# Patient Record
Sex: Female | Born: 1975 | Race: Black or African American | Hispanic: No | Marital: Single | Smoking: Never smoker
Health system: Southern US, Community
[De-identification: ages and names within clinical notes are randomized; demographics above are authoritative.]

## PROBLEM LIST (undated history)

## (undated) ENCOUNTER — Inpatient Hospital Stay (HOSPITAL_COMMUNITY): Payer: Self-pay

## (undated) DIAGNOSIS — A6000 Herpesviral infection of urogenital system, unspecified: Secondary | ICD-10-CM

## (undated) DIAGNOSIS — R51 Headache: Secondary | ICD-10-CM

## (undated) DIAGNOSIS — K219 Gastro-esophageal reflux disease without esophagitis: Secondary | ICD-10-CM

## (undated) DIAGNOSIS — D259 Leiomyoma of uterus, unspecified: Secondary | ICD-10-CM

## (undated) HISTORY — PX: TENDON REPAIR: SHX5111

## (undated) HISTORY — PX: HAND TENDON SURGERY: SHX663

## (undated) HISTORY — PX: WISDOM TOOTH EXTRACTION: SHX21

---

## 2002-02-03 ENCOUNTER — Other Ambulatory Visit: Admission: RE | Admit: 2002-02-03 | Discharge: 2002-02-03 | Payer: Self-pay | Admitting: Obstetrics and Gynecology

## 2003-02-05 ENCOUNTER — Other Ambulatory Visit: Admission: RE | Admit: 2003-02-05 | Discharge: 2003-02-05 | Payer: Self-pay | Admitting: Obstetrics and Gynecology

## 2004-09-16 ENCOUNTER — Emergency Department (HOSPITAL_COMMUNITY): Admission: EM | Admit: 2004-09-16 | Discharge: 2004-09-16 | Payer: Self-pay | Admitting: Family Medicine

## 2004-12-02 ENCOUNTER — Emergency Department (HOSPITAL_COMMUNITY): Admission: EM | Admit: 2004-12-02 | Discharge: 2004-12-02 | Payer: Self-pay | Admitting: Family Medicine

## 2004-12-29 ENCOUNTER — Emergency Department (HOSPITAL_COMMUNITY): Admission: EM | Admit: 2004-12-29 | Discharge: 2004-12-29 | Payer: Self-pay | Admitting: Emergency Medicine

## 2006-01-21 ENCOUNTER — Emergency Department (HOSPITAL_COMMUNITY): Admission: EM | Admit: 2006-01-21 | Discharge: 2006-01-21 | Payer: Self-pay | Admitting: Family Medicine

## 2006-11-18 ENCOUNTER — Emergency Department (HOSPITAL_COMMUNITY): Admission: EM | Admit: 2006-11-18 | Discharge: 2006-11-18 | Payer: Self-pay | Admitting: Family Medicine

## 2007-03-01 ENCOUNTER — Emergency Department (HOSPITAL_COMMUNITY): Admission: EM | Admit: 2007-03-01 | Discharge: 2007-03-01 | Payer: Self-pay | Admitting: Family Medicine

## 2007-11-27 ENCOUNTER — Emergency Department (HOSPITAL_COMMUNITY): Admission: EM | Admit: 2007-11-27 | Discharge: 2007-11-27 | Payer: Self-pay | Admitting: Family Medicine

## 2007-12-09 ENCOUNTER — Emergency Department (HOSPITAL_COMMUNITY): Admission: EM | Admit: 2007-12-09 | Discharge: 2007-12-09 | Payer: Self-pay | Admitting: Emergency Medicine

## 2008-06-01 ENCOUNTER — Emergency Department (HOSPITAL_COMMUNITY): Admission: EM | Admit: 2008-06-01 | Discharge: 2008-06-01 | Payer: Self-pay | Admitting: Emergency Medicine

## 2008-10-19 ENCOUNTER — Ambulatory Visit (HOSPITAL_BASED_OUTPATIENT_CLINIC_OR_DEPARTMENT_OTHER): Admission: RE | Admit: 2008-10-19 | Discharge: 2008-10-19 | Payer: Self-pay | Admitting: Orthopedic Surgery

## 2008-12-12 ENCOUNTER — Emergency Department (HOSPITAL_COMMUNITY): Admission: EM | Admit: 2008-12-12 | Discharge: 2008-12-12 | Payer: Self-pay | Admitting: Family Medicine

## 2009-07-16 ENCOUNTER — Emergency Department (HOSPITAL_COMMUNITY): Admission: EM | Admit: 2009-07-16 | Discharge: 2009-07-16 | Payer: Self-pay | Admitting: Family Medicine

## 2009-12-19 ENCOUNTER — Emergency Department (HOSPITAL_COMMUNITY): Admission: EM | Admit: 2009-12-19 | Discharge: 2009-12-19 | Payer: Self-pay | Admitting: Family Medicine

## 2010-02-15 ENCOUNTER — Emergency Department (HOSPITAL_COMMUNITY): Admission: EM | Admit: 2010-02-15 | Discharge: 2010-02-15 | Payer: Self-pay | Admitting: Emergency Medicine

## 2010-04-26 ENCOUNTER — Emergency Department (HOSPITAL_COMMUNITY): Admission: EM | Admit: 2010-04-26 | Discharge: 2010-04-26 | Payer: Self-pay | Admitting: Emergency Medicine

## 2010-09-10 ENCOUNTER — Emergency Department (HOSPITAL_COMMUNITY): Admission: EM | Admit: 2010-09-10 | Discharge: 2010-09-10 | Payer: Self-pay | Admitting: Emergency Medicine

## 2011-01-25 ENCOUNTER — Emergency Department (HOSPITAL_COMMUNITY)
Admission: EM | Admit: 2011-01-25 | Discharge: 2011-01-26 | Disposition: A | Discharge status: Home / Self Care | Payer: Self-pay | Attending: Emergency Medicine | Admitting: Emergency Medicine

## 2011-01-25 ENCOUNTER — Emergency Department (HOSPITAL_COMMUNITY): Payer: Self-pay

## 2011-01-25 DIAGNOSIS — J309 Allergic rhinitis, unspecified: Secondary | ICD-10-CM | POA: Insufficient documentation

## 2011-01-25 DIAGNOSIS — J3489 Other specified disorders of nose and nasal sinuses: Secondary | ICD-10-CM | POA: Insufficient documentation

## 2011-01-25 DIAGNOSIS — R079 Chest pain, unspecified: Secondary | ICD-10-CM | POA: Insufficient documentation

## 2011-01-25 DIAGNOSIS — R05 Cough: Secondary | ICD-10-CM | POA: Insufficient documentation

## 2011-01-25 DIAGNOSIS — R059 Cough, unspecified: Secondary | ICD-10-CM | POA: Insufficient documentation

## 2011-01-25 LAB — RAPID STREP SCREEN (MED CTR MEBANE ONLY): Streptococcus, Group A Screen (Direct): NEGATIVE

## 2011-02-02 LAB — URINE MICROSCOPIC-ADD ON

## 2011-02-02 LAB — URINALYSIS, ROUTINE W REFLEX MICROSCOPIC
Bilirubin Urine: NEGATIVE
Glucose, UA: NEGATIVE mg/dL
Specific Gravity, Urine: 1.018 (ref 1.005–1.030)

## 2011-02-02 LAB — PREGNANCY, URINE: Preg Test, Ur: NEGATIVE

## 2011-03-31 NOTE — Op Note (Signed)
NAMEARYEL, EDELEN               ACCOUNT NO.:  [id number]   MEDICAL RECORD NO.:  [id number]          PATIENT TYPE:  AMB   LOCATION:  DSC                          FACILITY:  [facility]   PHYSICIAN:  Katy Fitch. Sypher, M.D. 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   OPERATION:  Release of left long finger A1 pulley under local anesthesia  (this was performed in a minor room setting).   OPERATING SURGEON:  Katy Fitch. Sypher, MD   ASSISTANT:  Marveen Reeks Dasnoit, PA-C   ANESTHESIA:  Lidocaine 2% field block, left palm without supplemental  sedation.   SUPERVISING ANESTHESIOLOGIST/ANESTHETIST:  Katy Fitch. 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  She was  referred through the courtesy of Dr. Kern Reap in December 2008  for evaluation and management of a locking left long finger.   She was initially injected in December 2008 and had good relief for 5  months.  She then returned in May 2009 and had persistent triggering.  She was reinjected at that time and was advised in May 2009 that should  she have recurrence, release of the A1 pulley would be appropriate.   She returned in November 2009 with recurrent triggering and discomfort.  At that time, we advised proceeding with release of her left long finger  A1 pulley.  She is now brought to the minor operating room setting for  release of the left long finger A1 pulley under local anesthesia.   Preoperatively, she was advised of the potential risks and benefits  of  the surgery.  Questions were invited and answered in detail.   PROCEDURE IN DETAIL:  Anita Boyd was brought to the operating room  and placed in supine position upon the operating table.   Following Betadine prep, the left palm and flexor sheath was injected  with 2% lidocaine.  After 5 minutes, excellent anesthesia was achieved.   She subsequently had routine Betadine scrub and paint of the left upper  extremity.   The hand was draped with sterile towels.   The arm was exsanguinated by direct compression and fist compression by  Ms. Savary, followed by inflation of an arterial tourniquet on the  proximal left brachium to 220 mmHg.  The procedure commenced with an 8-  mm long incision directly over the proximal margin of the A1 pulley.   Subcutaneous tissues were carefully divided taking care to identify and  release the palmar fascia.  The A1 pulley was isolated, the synovial  sheath incised, and the A1 pulley was released in its midline with  scissors.  Thereafter, active range of motion finger was demonstrated.   There was  still catching at the junction between the A1 and A2 pulley.  Therefore, a small extension of the release of 3 mm was accomplished  distal to the A1 pulley.  This relieved the catching.   Ms. Fehnel had a remarkable amount of synovial fluid present.   This could be signs of a synovitis and possible inflammatory  predicament.   We will be vigilant for any signs of progressive synovitis and/or  arthritis.   The wound was inspected for bleeding points and subsequently repaired  with 3 horizontal mattress sutures of 5-0 nylon.  A compressive dressing  was applied with a Steri-Strip, sterile gauze, and Ace wrap.   Ms. Shelley is advised to continue with active range of motion excises.  She is provided a note stating that she should remain on right hand duty  at work for the next week.   I will see her back for followup in our office in a week for  suture  removal or sooner if any problems with dressing or other issues.  She is  encouraged to contact our office with any concerns.      Katy Fitch Sypher, M.D.  Electronically Signed     RVS/MEDQ  D:  10/19/2008  T:  10/19/2008  Job:  [id number]   cc:   Merlene Laughter. Renae Gloss, M.D.

## 2011-09-22 ENCOUNTER — Emergency Department (HOSPITAL_COMMUNITY)
Admission: EM | Admit: 2011-09-22 | Discharge: 2011-09-22 | Disposition: A | Discharge status: Home / Self Care | Payer: Self-pay | Attending: Emergency Medicine | Admitting: Emergency Medicine

## 2011-09-22 ENCOUNTER — Encounter: Payer: Self-pay | Admitting: *Deleted

## 2011-09-22 DIAGNOSIS — R109 Unspecified abdominal pain: Secondary | ICD-10-CM | POA: Insufficient documentation

## 2011-09-22 DIAGNOSIS — Z79899 Other long term (current) drug therapy: Secondary | ICD-10-CM | POA: Insufficient documentation

## 2011-09-22 DIAGNOSIS — R339 Retention of urine, unspecified: Secondary | ICD-10-CM | POA: Insufficient documentation

## 2011-09-22 DIAGNOSIS — R300 Dysuria: Secondary | ICD-10-CM | POA: Insufficient documentation

## 2011-09-22 DIAGNOSIS — R10819 Abdominal tenderness, unspecified site: Secondary | ICD-10-CM | POA: Insufficient documentation

## 2011-09-22 DIAGNOSIS — N390 Urinary tract infection, site not specified: Secondary | ICD-10-CM | POA: Insufficient documentation

## 2011-09-22 LAB — URINALYSIS, ROUTINE W REFLEX MICROSCOPIC
Bilirubin Urine: NEGATIVE
Ketones, ur: NEGATIVE mg/dL
Nitrite: POSITIVE — AB
Protein, ur: NEGATIVE mg/dL
Urobilinogen, UA: 0.2 mg/dL (ref 0.0–1.0)

## 2011-09-22 LAB — URINE MICROSCOPIC-ADD ON

## 2011-09-22 MED ORDER — HYDROCODONE-ACETAMINOPHEN 5-325 MG PO TABS: 2.0000 | ORAL_TABLET | ORAL | Status: AC | PRN

## 2011-09-22 MED ORDER — CIPROFLOXACIN HCL 500 MG PO TABS: 500.0000 mg | ORAL_TABLET | Freq: Two times a day (BID) | ORAL | Status: AC

## 2011-09-22 NOTE — ED Notes (Signed)
Pt in c/o lower back pain and pain with urination since Saturday, states pain has increased over last few days

## 2011-09-22 NOTE — ED Provider Notes (Signed)
History     CSN: [id number] Arrival date & time: 09/22/2011  6:45 AM   First MD Initiated Contact with Patient 09/22/11 [phone]      Chief Complaint  Patient presents with  . Urinary Tract Infection    (Consider location/radiation/quality/duration/timing/severity/associated sxs/prior treatment) Patient is a 35 y.o. female presenting with urinary tract infection. The history is provided by the patient. No language interpreter was used.  Urinary Tract Infection This is a new problem. The current episode started in the past 7 days. The problem occurs constantly. The problem has been unchanged. Associated symptoms include abdominal pain (suprapubic pain). Exacerbated by: urinating. Treatments tried: ASO on suday. The treatment provided mild relief.  Reports urinary retention and burning intermittant x 6 days.  Denies vaginal odor, discharge or bleeding. " Feels like an infection I had last time I was here. "  History reviewed. No pertinent past medical history.  History reviewed. No pertinent past surgical history.  History reviewed. No pertinent family history.  History  Substance Use Topics  . Smoking status: Never Smoker   . Smokeless tobacco: Not on file  . Alcohol Use: Yes    OB History    Grav Para Term Preterm Abortions TAB SAB Ect Mult Living                  Review of Systems  Gastrointestinal: Positive for abdominal pain (suprapubic pain).  All other systems reviewed and are negative.    Allergies  Review of patient's allergies indicates no known allergies.  Home Medications   Current Outpatient Rx  Name Route Sig Dispense Refill  . OMEPRAZOLE MAGNESIUM 20 MG PO TBEC Oral Take 20 mg by mouth daily.      Marland Kitchen VALACYCLOVIR HCL 500 MG PO TABS Oral Take 500 mg by mouth daily.       BP 132/97  Pulse 81  Temp(Src) 98.8 F (37.1 C) (Oral)  Resp 20  SpO2 100%  LMP 08/31/2011  Physical Exam  Constitutional: She is oriented to person, place, and time. She appears  well-nourished.  Eyes: Pupils are equal, round, and reactive to light.  Cardiovascular: Normal rate.   Abdominal: Soft. Bowel sounds are normal. There is tenderness.       No nausea and vomiting  Genitourinary: No vaginal discharge found.       R CVA tenderness  Suprapubic pain  Neurological: She is alert and oriented to person, place, and time.  Skin: Skin is warm and dry.    ED Course  Procedures (including critical care time)  Labs Reviewed  URINALYSIS, ROUTINE W REFLEX MICROSCOPIC - Abnormal; Notable for the following:    Color, Urine ORANGE (*) BIOCHEMICALS MAY BE AFFECTED BY COLOR   Hgb urine dipstick SMALL (*)    Nitrite POSITIVE (*)    All other components within normal limits  URINE MICROSCOPIC-ADD ON  POCT PREGNANCY, URINE   No results found.   No diagnosis found.    MDM         Jethro Bastos, NP 09/22/11 [phone]

## 2011-09-22 NOTE — ED Provider Notes (Signed)
Medical screening examination/treatment/procedure(s) were performed by non-physician practitioner and as supervising physician I was immediately available for consultation/collaboration.   Gwyneth Sprout, MD 09/22/11 1022

## 2011-09-22 NOTE — ED Notes (Signed)
C/o back pain 7/10, started last Thursday, pt was cleaning fridge that day and hit back of head, back pain started since. Dysuria since Sunday night, also have urinary urgency, urinary frequency, also reports thirst. positive bilateral CVA tenderness. Breath sound clear. Neurologically intact.

## 2011-12-08 ENCOUNTER — Emergency Department (HOSPITAL_COMMUNITY)
Admission: EM | Admit: 2011-12-08 | Discharge: 2011-12-09 | Disposition: A | Discharge status: Home / Self Care | Payer: Self-pay | Attending: Emergency Medicine | Admitting: Emergency Medicine

## 2011-12-08 ENCOUNTER — Encounter (HOSPITAL_COMMUNITY): Payer: Self-pay | Admitting: *Deleted

## 2011-12-08 DIAGNOSIS — J069 Acute upper respiratory infection, unspecified: Secondary | ICD-10-CM | POA: Insufficient documentation

## 2011-12-08 DIAGNOSIS — H9209 Otalgia, unspecified ear: Secondary | ICD-10-CM | POA: Insufficient documentation

## 2011-12-08 DIAGNOSIS — J3489 Other specified disorders of nose and nasal sinuses: Secondary | ICD-10-CM | POA: Insufficient documentation

## 2011-12-08 DIAGNOSIS — R112 Nausea with vomiting, unspecified: Secondary | ICD-10-CM | POA: Insufficient documentation

## 2011-12-08 DIAGNOSIS — R0981 Nasal congestion: Secondary | ICD-10-CM

## 2011-12-08 DIAGNOSIS — R197 Diarrhea, unspecified: Secondary | ICD-10-CM | POA: Insufficient documentation

## 2011-12-08 LAB — POCT I-STAT, CHEM 8
Chloride: 106 mEq/L (ref 96–112)
Creatinine, Ser: 0.6 mg/dL (ref 0.50–1.10)
Hemoglobin: 12.6 g/dL (ref 12.0–15.0)
Potassium: 3.1 mEq/L — ABNORMAL LOW (ref 3.5–5.1)
Sodium: 142 mEq/L (ref 135–145)

## 2011-12-08 MED ORDER — POTASSIUM CHLORIDE CRYS ER 20 MEQ PO TBCR
40.0000 meq | EXTENDED_RELEASE_TABLET | Freq: Once | ORAL | Status: AC
  Administered 2011-12-08: 40 meq via ORAL
  Filled 2011-12-08: qty 2

## 2011-12-08 MED ORDER — SODIUM CHLORIDE 0.9 % IV BOLUS (SEPSIS)
1000.0000 mL | Freq: Once | INTRAVENOUS | Status: AC
  Administered 2011-12-08: 1000 mL via INTRAVENOUS

## 2011-12-08 MED ORDER — ONDANSETRON HCL 4 MG/2ML IJ SOLN
4.0000 mg | Freq: Once | INTRAMUSCULAR | Status: AC
  Administered 2011-12-08: 4 mg via INTRAVENOUS
  Filled 2011-12-08: qty 2

## 2011-12-08 NOTE — ED Provider Notes (Signed)
History     CSN: [id number]  Arrival date & time 12/08/11  1945   First MD Initiated Contact with Patient 12/08/11 2218      Chief Complaint  Patient presents with  . URI  . Emesis     HPI  History provided by the patient. Patient is a 36 year old female with no significant past medical history who presents with complaints of nasal congestion, rhinorrhea, cough, nausea and vomiting. Patient reports that symptoms began with nasal congestion 3 days ago. Symptoms have gradually worsened. Patient reports taking Mucinex D for the past several days without any significant improvements. Patient also states that nausea and vomiting has become to the point where she is unable to keep any solid foods down. Patient denies having any fever. She does report some associated chills. Patient does not know of any specific sick contacts. Patient has no other significant past medical history.     History reviewed. No pertinent past medical history.  History reviewed. No pertinent past surgical history.  History reviewed. No pertinent family history.  History  Substance Use Topics  . Smoking status: Never Smoker   . Smokeless tobacco: Not on file  . Alcohol Use: Yes    OB History    Grav Para Term Preterm Abortions TAB SAB Ect Mult Living                  Review of Systems  Constitutional: Positive for chills and appetite change. Negative for fever.  HENT: Positive for ear pain, congestion, sore throat and rhinorrhea.   Respiratory: Positive for cough. Negative for shortness of breath.   Cardiovascular: Negative for chest pain.  Gastrointestinal: Positive for nausea, vomiting and diarrhea. Negative for abdominal pain and constipation.  Genitourinary: Negative for dysuria, frequency, hematuria and flank pain.  Skin: Negative for rash.  All other systems reviewed and are negative.    Allergies  Review of patient's allergies indicates no known allergies.  Home Medications   Current  Outpatient Rx  Name Route Sig Dispense Refill  . NORETHINDRONE ACET-ETHINYL EST 1-20 MG-MCG PO TABS Oral Take 1 tablet by mouth daily.      Marland Kitchen OMEPRAZOLE MAGNESIUM 20 MG PO TBEC Oral Take 20 mg by mouth daily.      Marland Kitchen PSEUDOEPHEDRINE-GUAIFENESIN ER 60-600 MG PO TB12 Oral Take 1 tablet by mouth every 12 (twelve) hours as needed. For congestion.    Marland Kitchen VALACYCLOVIR HCL 500 MG PO TABS Oral Take 500 mg by mouth daily.       BP 144/89  Pulse 99  Temp(Src) 99.5 F (37.5 C) (Oral)  Resp 20  SpO2 100%  Physical Exam  Nursing note and vitals reviewed. Constitutional: She is oriented to person, place, and time. She appears well-developed and well-nourished. No distress.  HENT:  Head: Normocephalic and atraumatic.  Right Ear: External ear normal.  Left Ear: External ear normal.  Mouth/Throat: Oropharynx is clear and moist.       Patient with large tonsils but without any erythema or exudate.  Eyes: Conjunctivae and EOM are normal. Pupils are equal, round, and reactive to light.  Neck: Normal range of motion. Neck supple.       No meningeal signs  Cardiovascular: Normal rate and regular rhythm.   Pulmonary/Chest: Effort normal and breath sounds normal. No respiratory distress. She has no wheezes. She has no rales.  Abdominal: Soft. Bowel sounds are normal. She exhibits no distension. There is no tenderness. There is no rebound.  Lymphadenopathy:  She has no cervical adenopathy.  Neurological: She is alert and oriented to person, place, and time.  Skin: Skin is warm and dry. No rash noted.  Psychiatric: She has a normal mood and affect. Her behavior is normal.    ED Course  Procedures    Labs Reviewed  I-STAT, CHEM 8   Results for orders placed during the hospital encounter of 12/08/11  POCT I-STAT, CHEM 8      Component Value Range   Sodium 142  135 - 145 (mEq/L)   Potassium 3.1 (*) 3.5 - 5.1 (mEq/L)   Chloride 106  96 - 112 (mEq/L)   BUN 10  6 - 23 (mg/dL)   Creatinine, Ser 1.61   0.50 - 1.10 (mg/dL)   Glucose, Bld 95  70 - 99 (mg/dL)   Calcium, Ion 0.96  0.45 - 1.32 (mmol/L)   TCO2 26  0 - 100 (mmol/L)   Hemoglobin 12.6  12.0 - 15.0 (g/dL)   HCT 40.9  81.1 - 91.4 (%)      MDM  10:20 PM patient seen and evaluated. Patient no acute distress. Patient well-appearing and nontoxic.  Patient continues to do well. She feels better after receiving IV fluids and Zofran. She is tolerating by mouth fluids. Potassium slightly low, potassium supplements given. At this time feel patient is able to return home. Plan to provide prescriptions for symptomatic treatment.      Angus Seller, [place] 12/09/11 [phone]

## 2011-12-08 NOTE — ED Notes (Signed)
Pt in c/o cough and congestion and sore throat, also vomiting since saturday

## 2011-12-09 MED ORDER — CETIRIZINE-PSEUDOEPHEDRINE ER 5-120 MG PO TB12: 1.0000 | ORAL_TABLET | Freq: Every day | ORAL | Status: AC

## 2011-12-09 MED ORDER — HYDROCOD POLST-CHLORPHEN POLST 10-8 MG/5ML PO LQCR: 5.0000 mL | Freq: Two times a day (BID) | ORAL | Status: DC

## 2011-12-09 MED ORDER — PROMETHAZINE HCL 25 MG PO TABS: 25.0000 mg | ORAL_TABLET | Freq: Four times a day (QID) | ORAL | Status: AC | PRN

## 2011-12-11 NOTE — ED Provider Notes (Signed)
Medical screening examination/treatment/procedure(s) were performed by non-physician practitioner and as supervising physician I was immediately available for consultation/collaboration. Devoria Albe, MD, FACEP   Ward Givens, MD 12/11/11 [phone]

## 2012-05-09 ENCOUNTER — Encounter (HOSPITAL_COMMUNITY): Payer: Self-pay | Admitting: *Deleted

## 2012-05-09 ENCOUNTER — Emergency Department (HOSPITAL_COMMUNITY): Payer: Self-pay

## 2012-05-09 ENCOUNTER — Emergency Department (HOSPITAL_COMMUNITY)
Admission: EM | Admit: 2012-05-09 | Discharge: 2012-05-10 | Disposition: A | Discharge status: Home / Self Care | Payer: Self-pay | Attending: Emergency Medicine | Admitting: Emergency Medicine

## 2012-05-09 DIAGNOSIS — N760 Acute vaginitis: Secondary | ICD-10-CM | POA: Insufficient documentation

## 2012-05-09 DIAGNOSIS — A499 Bacterial infection, unspecified: Secondary | ICD-10-CM | POA: Insufficient documentation

## 2012-05-09 DIAGNOSIS — B9689 Other specified bacterial agents as the cause of diseases classified elsewhere: Secondary | ICD-10-CM | POA: Insufficient documentation

## 2012-05-09 LAB — CBC
HCT: 34.1 % — ABNORMAL LOW (ref 36.0–46.0)
Platelets: 386 10*3/uL (ref 150–400)
RBC: 4.56 MIL/uL (ref 3.87–5.11)
RDW: 15.5 % (ref 11.5–15.5)
WBC: 10.5 10*3/uL (ref 4.0–10.5)

## 2012-05-09 LAB — URINALYSIS, ROUTINE W REFLEX MICROSCOPIC
Hgb urine dipstick: NEGATIVE
Nitrite: NEGATIVE
Specific Gravity, Urine: 1.014 (ref 1.005–1.030)
Urobilinogen, UA: 0.2 mg/dL (ref 0.0–1.0)
pH: 6 (ref 5.0–8.0)

## 2012-05-09 LAB — DIFFERENTIAL
Basophils Absolute: 0.1 10*3/uL (ref 0.0–0.1)
Lymphocytes Relative: 40 % (ref 12–46)
Neutro Abs: 5.6 10*3/uL (ref 1.7–7.7)

## 2012-05-09 LAB — WET PREP, GENITAL: Yeast Wet Prep HPF POC: NONE SEEN

## 2012-05-09 LAB — BASIC METABOLIC PANEL
CO2: 23 mEq/L (ref 19–32)
Chloride: 98 mEq/L (ref 96–112)
Sodium: 132 mEq/L — ABNORMAL LOW (ref 135–145)

## 2012-05-09 LAB — POCT PREGNANCY, URINE: Preg Test, Ur: NEGATIVE

## 2012-05-09 MED ORDER — OXYCODONE-ACETAMINOPHEN 5-325 MG PO TABS
2.0000 | ORAL_TABLET | Freq: Once | ORAL | Status: AC
  Administered 2012-05-09: 2 via ORAL
  Filled 2012-05-09: qty 2

## 2012-05-09 NOTE — ED Notes (Signed)
Pt reports low back pain since yesterday. C/o feeling like she is not able to empty bladder completely when urinating. C/o urinary frequency, blood in urine yesterday.

## 2012-05-09 NOTE — ED Provider Notes (Signed)
History     CSN: [id number]  Arrival date & time 05/09/12  1623   First MD Initiated Contact with Patient 05/09/12 2103      Chief Complaint  Patient presents with  . Back Pain  . Dysuria    (Consider location/radiation/quality/duration/timing/severity/associated sxs/prior treatment) HPI Comments: Patient has been having low back pain L>R for the past 2 days this yesterday noticed blood in her urine X1 with wiping none since came to the ED thinking she had a UTI but has no dysuria or frequency, vaginal discharge or Hx of kidney stone   Patient is a 36 y.o. female presenting with back pain and dysuria. The history is provided by the patient.  Back Pain  This is a new problem. The current episode started yesterday. The problem occurs constantly. The problem has not changed since onset.The pain is associated with no known injury. The pain is present in the lumbar spine. The pain is at a severity of 8/10. The pain is moderate. Associated symptoms include abdominal pain. Pertinent negatives include no chest pain, no fever, no dysuria and no weakness.  Dysuria  Associated symptoms include nausea, hematuria and flank pain. Pertinent negatives include no chills, no vomiting and no frequency.    History reviewed. No pertinent past medical history.  History reviewed. No pertinent past surgical history.  No family history on file.  History  Substance Use Topics  . Smoking status: Never Smoker   . Smokeless tobacco: Not on file  . Alcohol Use: Yes     2x/wk    OB History    Grav Para Term Preterm Abortions TAB SAB Ect Mult Living                  Review of Systems  Constitutional: Negative for fever and chills.  HENT: Negative for congestion.   Respiratory: Negative for shortness of breath.   Cardiovascular: Negative for chest pain.  Gastrointestinal: Positive for nausea and abdominal pain. Negative for vomiting, diarrhea and constipation.  Genitourinary: Positive for hematuria  and flank pain. Negative for dysuria, frequency, vaginal bleeding, vaginal discharge and vaginal pain.  Musculoskeletal: Positive for back pain.  Neurological: Negative for weakness.    Allergies  Review of patient's allergies indicates no known allergies.  Home Medications   Current Outpatient Rx  Name Route Sig Dispense Refill  . CETIRIZINE-PSEUDOEPHEDRINE ER 5-120 MG PO TB12 Oral Take 1 tablet by mouth daily. 30 tablet 0  . NORETHINDRONE ACET-ETHINYL EST 1-20 MG-MCG PO TABS Oral Take 1 tablet by mouth daily.      Marland Kitchen OMEPRAZOLE MAGNESIUM 20 MG PO TBEC Oral Take 20 mg by mouth daily.      Marland Kitchen VALACYCLOVIR HCL 500 MG PO TABS Oral Take 500 mg by mouth daily.     Marland Kitchen METRONIDAZOLE 500 MG PO TABS Oral Take 1 tablet (500 mg total) by mouth 2 (two) times daily. 14 tablet 0  . OXYCODONE-ACETAMINOPHEN 5-325 MG PO TABS Oral Take 1 tablet by mouth every 6 (six) hours as needed for pain. 15 tablet 0    BP 137/94  Pulse 79  Temp 98.5 F (36.9 C) (Oral)  Resp 14  SpO2 100%  LMP 05/04/2012  Physical Exam  Constitutional: She appears well-developed and well-nourished.  HENT:  Head: Normocephalic.  Neck: Normal range of motion.  Cardiovascular: Normal rate.   Pulmonary/Chest: She is in respiratory distress.  Abdominal: She exhibits no distension. There is no tenderness.  Musculoskeletal: Normal range of motion.  Arms:   ED Course  Procedures (including critical care time)  Labs Reviewed  URINALYSIS, ROUTINE W REFLEX MICROSCOPIC - Abnormal; Notable for the following:    APPearance CLOUDY (*)     All other components within normal limits  CBC - Abnormal; Notable for the following:    Hemoglobin 11.0 (*)     HCT 34.1 (*)     MCV 74.8 (*)     MCH 24.1 (*)     All other components within normal limits  DIFFERENTIAL - Abnormal; Notable for the following:    Lymphs Abs 4.2 (*)     All other components within normal limits  BASIC METABOLIC PANEL - Abnormal; Notable for the following:     Sodium 132 (*)     All other components within normal limits  WET PREP, GENITAL - Abnormal; Notable for the following:    Clue Cells Wet Prep HPF POC FEW (*)     WBC, Wet Prep HPF POC FEW (*)     All other components within normal limits  POCT PREGNANCY, URINE  GC/CHLAMYDIA PROBE AMP, GENITAL   Ct Abdomen Pelvis Wo Contrast  05/09/2012  *RADIOLOGY REPORT*  Clinical Data: Left flank and back pain, dysuria  CT ABDOMEN AND PELVIS WITHOUT CONTRAST  Technique:  Multidetector CT imaging of the abdomen and pelvis was performed following the standard protocol without intravenous contrast.  Comparison: None.  Findings: Lung bases clear.  Normal heart size.  No pericardial or pleural effusion.  Abdomen:  Kidneys demonstrate no acute obstructing hydronephrosis, acute perinephric inflammatory process, hydroureter, or obstructing ureteral calculus.  Kidneys are symmetric in size shape and position.  Liver, gallbladder, biliary system, pancreas, spleen, and adrenal glands are within normal limits for noncontrast study and demonstrate no acute process.  Negative for bowel obstruction, dilatation, ileus, or free air.  No abdominal free fluid, fluid collection, hemorrhage, hematoma, abscess, or adenopathy.  Normal appendix demonstrated.  Pelvis:  Small amount pelvic free fluid, suspect physiologic. Urinary bladder unremarkable.  Uterus and adnexa normal in size. No acute distal bowel process.  No pelvic fluid collection, abscess, adenopathy, inguinal abnormality, or hernia.  No acute osseous finding.  IMPRESSION: No acute obstructing urinary tract or ureteral calculus.  No acute intra-abdominal or pelvic process by noncontrast CT  Original Report Authenticated By: Judie Petit. Ruel Favors, M.D.     1. Bacterial vaginosis       MDM   Will evaluate for UTI, BV, STD kidney stone         Arman Filter, NP 05/10/12 0058  Arman Filter, NP 05/10/12 [phone]

## 2012-05-09 NOTE — ED Notes (Signed)
Wet prep ready at bedside

## 2012-05-10 LAB — GC/CHLAMYDIA PROBE AMP, GENITAL: GC Probe Amp, Genital: NEGATIVE

## 2012-05-10 MED ORDER — METRONIDAZOLE 500 MG PO TABS
500.0000 mg | ORAL_TABLET | Freq: Once | ORAL | Status: AC
  Administered 2012-05-10: 500 mg via ORAL
  Filled 2012-05-10: qty 1

## 2012-05-10 MED ORDER — METRONIDAZOLE 500 MG PO TABS: 500.0000 mg | ORAL_TABLET | Freq: Two times a day (BID) | ORAL | Status: AC

## 2012-05-10 MED ORDER — OXYCODONE-ACETAMINOPHEN 5-325 MG PO TABS: 1.0000 | ORAL_TABLET | Freq: Four times a day (QID) | ORAL | Status: AC | PRN

## 2012-05-10 NOTE — Discharge Instructions (Signed)
Bacterial Vaginosis Bacterial vaginosis (BV) is a vaginal infection where the normal balance of bacteria in the vagina is disrupted. The normal balance is then replaced by an overgrowth of certain bacteria. There are several different kinds of bacteria that can cause BV. BV is the most common vaginal infection in women of childbearing age. CAUSES   The cause of BV is not fully understood. BV develops when there is an increase or imbalance of harmful bacteria.   Some activities or behaviors can upset the normal balance of bacteria in the vagina and put women at increased risk including:   Having a new sex partner or multiple sex partners.   Douching.   Using an intrauterine device (IUD) for contraception.   It is not clear what role sexual activity plays in the development of BV. However, women that have never had sexual intercourse are rarely infected with BV.  Women do not get BV from toilet seats, bedding, swimming pools or from touching objects around them.  SYMPTOMS   Grey vaginal discharge.   A fish-like odor with discharge, especially after sexual intercourse.   Itching or burning of the vagina and vulva.   Burning or pain with urination.   Some women have no signs or symptoms at all.  DIAGNOSIS  Your caregiver must examine the vagina for signs of BV. Your caregiver will perform lab tests and look at the sample of vaginal fluid through a microscope. They will look for bacteria and abnormal cells (clue cells), a pH test higher than 4.5, and a positive amine test all associated with BV.  RISKS AND COMPLICATIONS   Pelvic inflammatory disease (PID).   Infections following gynecology surgery.   Developing HIV.   Developing herpes virus.  TREATMENT  Sometimes BV will clear up without treatment. However, all women with symptoms of BV should be treated to avoid complications, especially if gynecology surgery is planned. Female partners generally do not need to be treated. However,  BV may spread between female sex partners so treatment is helpful in preventing a recurrence of BV.   BV may be treated with antibiotics. The antibiotics come in either pill or vaginal cream forms. Either can be used with nonpregnant or pregnant women, but the recommended dosages differ. These antibiotics are not harmful to the baby.   BV can recur after treatment. If this happens, a second round of antibiotics will often be prescribed.   Treatment is important for pregnant women. If not treated, BV can cause a premature delivery, especially for a pregnant woman who had a premature birth in the past. All pregnant women who have symptoms of BV should be checked and treated.   For chronic reoccurrence of BV, treatment with a type of prescribed gel vaginally twice a week is helpful.  HOME CARE INSTRUCTIONS   Finish all medication as directed by your caregiver.   Do not have sex until treatment is completed.   Tell your sexual partner that you have a vaginal infection. They should see their caregiver and be treated if they have problems, such as a mild rash or itching.   Practice safe sex. Use condoms. Only have 1 sex partner.  PREVENTION  Basic prevention steps can help reduce the risk of upsetting the natural balance of bacteria in the vagina and developing BV:  Do not have sexual intercourse (be abstinent).   Do not douche.   Use all of the medicine prescribed for treatment of BV, even if the signs and symptoms go away.     Tell your sex partner if you have BV. That way, they can be treated, if needed, to prevent reoccurrence.  SEEK MEDICAL CARE IF:   Your symptoms are not improving after 3 days of treatment.   You have increased discharge, pain, or fever.  MAKE SURE YOU:   Understand these instructions.   Will watch your condition.   Will get help right away if you are not doing well or get worse.  FOR MORE INFORMATION  Division of STD Prevention (DSTDP), Centers for Disease  Control and Prevention: [employer] American [employer] Health Association (ASHA): www.ashastd.org  Document Released: 11/02/2005 Document Revised: 10/22/2011 Document Reviewed: 04/25/2009 [facility] Patient Information 2012 [employer], [place]. Please take all the medication as directed until completed please follow up with your OB/GYN

## 2012-05-10 NOTE — ED Provider Notes (Signed)
Medical screening examination/treatment/procedure(s) were performed by non-physician practitioner and as supervising physician I was immediately available for consultation/collaboration.  Doug Sou, MD 05/10/12 1459

## 2012-07-21 ENCOUNTER — Emergency Department (HOSPITAL_BASED_OUTPATIENT_CLINIC_OR_DEPARTMENT_OTHER)
Admission: EM | Admit: 2012-07-21 | Discharge: 2012-07-21 | Disposition: A | Discharge status: Home / Self Care | Payer: Self-pay | Attending: Emergency Medicine | Admitting: Emergency Medicine

## 2012-07-21 ENCOUNTER — Encounter (HOSPITAL_BASED_OUTPATIENT_CLINIC_OR_DEPARTMENT_OTHER): Payer: Self-pay | Admitting: *Deleted

## 2012-07-21 DIAGNOSIS — J069 Acute upper respiratory infection, unspecified: Secondary | ICD-10-CM | POA: Insufficient documentation

## 2012-07-21 NOTE — ED Notes (Signed)
Pt amb to room 12 with quick steady gait in nad. Pt reports cough producing thick yellow sputum, congestion, sinus pressure x 1 week. No relief with otc medications.

## 2012-07-21 NOTE — ED Provider Notes (Signed)
History     CSN: [id number]  Arrival date & time 07/21/12  1031   First MD Initiated Contact with Patient 07/21/12 1108      Chief Complaint  Patient presents with  . Cough  . Sore Throat     Patient is a 36 y.o. female presenting with cough. The history is provided by the patient.  Cough This is a new problem. The current episode started more than 2 days ago. The problem occurs hourly. The problem has not changed since onset.The cough is productive of sputum. There has been no fever. Associated symptoms include ear congestion. Pertinent negatives include no shortness of breath.    PMH - none  History reviewed. No pertinent past surgical history.  History reviewed. No pertinent family history.  History  Substance Use Topics  . Smoking status: Never Smoker   . Smokeless tobacco: Not on file  . Alcohol Use: Yes     2x/wk    OB History    Grav Para Term Preterm Abortions TAB SAB Ect Mult Living                  Review of Systems  Respiratory: Positive for cough. Negative for shortness of breath.   Gastrointestinal: Negative for vomiting.    Allergies  Review of patient's allergies indicates no known allergies.  Home Medications   Current Outpatient Rx  Name Route Sig Dispense Refill  . CETIRIZINE-PSEUDOEPHEDRINE ER 5-120 MG PO TB12 Oral Take 1 tablet by mouth daily. 30 tablet 0  . NORETHINDRONE ACET-ETHINYL EST 1-20 MG-MCG PO TABS Oral Take 1 tablet by mouth daily.      Marland Kitchen OMEPRAZOLE MAGNESIUM 20 MG PO TBEC Oral Take 20 mg by mouth daily.      Marland Kitchen VALACYCLOVIR HCL 500 MG PO TABS Oral Take 500 mg by mouth daily.       BP 126/82  Pulse 79  Temp 98 F (36.7 C) (Oral)  Resp 18  Ht 5\' 10"  (1.778 m)  Wt 210 lb (95.255 kg)  BMI 30.13 kg/m2  SpO2 100%  LMP 06/27/2012  Physical Exam CONSTITUTIONAL: Well developed/well nourished HEAD AND FACE: Normocephalic/atraumatic EYES: EOMI/PERRL ENMT: Mucous membranes moist, left tm/right tm normal, uvula midline, pharynx  non-erythematous NECK: supple no meningeal signs CV: S1/S2 noted, no murmurs/rubs/gallops noted LUNGS: Lungs are clear to auscultation bilaterally, no apparent distress ABDOMEN: soft, nontender, no rebound or guarding NEURO: Pt is awake/alert, moves all extremitiesx4 EXTREMITIES: pulses normal, full ROM SKIN: warm, color normal PSYCH: no abnormalities of mood noted  ED Course  Procedures     1. URI (upper respiratory infection)       MDM  Nursing notes including past medical history and social history reviewed and considered in documentation         Joya Gaskins, MD 07/21/12 1223

## 2012-11-10 ENCOUNTER — Encounter (HOSPITAL_BASED_OUTPATIENT_CLINIC_OR_DEPARTMENT_OTHER): Payer: Self-pay | Admitting: Student

## 2012-11-10 ENCOUNTER — Emergency Department (HOSPITAL_BASED_OUTPATIENT_CLINIC_OR_DEPARTMENT_OTHER)
Admission: EM | Admit: 2012-11-10 | Discharge: 2012-11-10 | Disposition: A | Discharge status: Home / Self Care | Payer: Self-pay | Attending: Emergency Medicine | Admitting: Emergency Medicine

## 2012-11-10 DIAGNOSIS — IMO0001 Reserved for inherently not codable concepts without codable children: Secondary | ICD-10-CM | POA: Insufficient documentation

## 2012-11-10 DIAGNOSIS — J111 Influenza due to unidentified influenza virus with other respiratory manifestations: Secondary | ICD-10-CM | POA: Insufficient documentation

## 2012-11-10 DIAGNOSIS — Z79899 Other long term (current) drug therapy: Secondary | ICD-10-CM | POA: Insufficient documentation

## 2012-11-10 DIAGNOSIS — R112 Nausea with vomiting, unspecified: Secondary | ICD-10-CM | POA: Insufficient documentation

## 2012-11-10 DIAGNOSIS — R059 Cough, unspecified: Secondary | ICD-10-CM | POA: Insufficient documentation

## 2012-11-10 DIAGNOSIS — J029 Acute pharyngitis, unspecified: Secondary | ICD-10-CM | POA: Insufficient documentation

## 2012-11-10 DIAGNOSIS — R05 Cough: Secondary | ICD-10-CM | POA: Insufficient documentation

## 2012-11-10 LAB — RAPID STREP SCREEN (MED CTR MEBANE ONLY): Streptococcus, Group A Screen (Direct): NEGATIVE

## 2012-11-10 MED ORDER — ACETAMINOPHEN 325 MG PO TABS
650.0000 mg | ORAL_TABLET | Freq: Once | ORAL | Status: AC
  Administered 2012-11-10: 650 mg via ORAL
  Filled 2012-11-10: qty 2

## 2012-11-10 MED ORDER — BENZONATATE 100 MG PO CAPS: 100.0000 mg | ORAL_CAPSULE | Freq: Three times a day (TID) | ORAL | Status: DC

## 2012-11-10 MED ORDER — HYDROCODONE-ACETAMINOPHEN 7.5-500 MG/15ML PO SOLN: 15.0000 mL | Freq: Four times a day (QID) | ORAL | Status: DC | PRN

## 2012-11-10 NOTE — ED Notes (Signed)
Flu like s/sx with fever, cough, throat, generalized chills and aches

## 2012-11-10 NOTE — ED Provider Notes (Addendum)
History     CSN: [id number]  Arrival date & time 11/10/12  1301   First MD Initiated Contact with Patient 11/10/12 1415      Chief Complaint  Patient presents with  . Influenza    (Consider location/radiation/quality/duration/timing/severity/associated sxs/prior treatment) HPI Pt reports she has not felt well for about 3 weeks with symptoms she thought were sinus infection but she has been markedly worse in the last 3 days with fever, chills, sore throat, dry cough, diffuse myalgias, and occasional vomiting. She has been taking Motrin 400mg  with minimal improvement.   History reviewed. No pertinent past medical history.  Past Surgical History  Procedure Date  . Tendon repair     left hand    History reviewed. No pertinent family history.  History  Substance Use Topics  . Smoking status: Never Smoker   . Smokeless tobacco: Not on file  . Alcohol Use: Yes     Comment: 2x/wk    OB History    Grav Para Term Preterm Abortions TAB SAB Ect Mult Living                  Review of Systems All other systems reviewed and are negative except as noted in HPI.   Allergies  Review of patient's allergies indicates no known allergies.  Home Medications   Current Outpatient Rx  Name  Route  Sig  Dispense  Refill  . CETIRIZINE-PSEUDOEPHEDRINE ER 5-120 MG PO TB12   Oral   Take 1 tablet by mouth daily.   30 tablet   0   . NORETHINDRONE ACET-ETHINYL EST 1-20 MG-MCG PO TABS   Oral   Take 1 tablet by mouth daily.           Marland Kitchen OMEPRAZOLE MAGNESIUM 20 MG PO TBEC   Oral   Take 20 mg by mouth daily.           Marland Kitchen VALACYCLOVIR HCL 500 MG PO TABS   Oral   Take 500 mg by mouth daily.            BP 118/66  Pulse 112  Temp 102.4 F (39.1 C) (Oral)  Resp 18  Ht 5\' 10"  (1.778 m)  Wt 210 lb (95.255 kg)  BMI 30.13 kg/m2  SpO2 96%  LMP 11/06/2012  Physical Exam  Nursing note and vitals reviewed. Constitutional: She is oriented to person, place, and time. She appears  well-developed and well-nourished.  HENT:  Head: Normocephalic and atraumatic.       Tonsils are 2+ and equal, no exudate  Eyes: EOM are normal. Pupils are equal, round, and reactive to light.  Neck: Normal range of motion. Neck supple.  Cardiovascular: Normal heart sounds and intact distal pulses.        Tachycardia  Pulmonary/Chest: Effort normal and breath sounds normal.  Abdominal: Bowel sounds are normal. She exhibits no distension. There is no tenderness.  Musculoskeletal: Normal range of motion. She exhibits no edema and no tenderness.  Lymphadenopathy:    She has cervical adenopathy.  Neurological: She is alert and oriented to person, place, and time. She has normal strength. No cranial nerve deficit or sensory deficit.  Skin: Skin is warm and dry. No rash noted.  Psychiatric: She has a normal mood and affect.    ED Course  Procedures (including critical care time)   Labs Reviewed  RAPID STREP SCREEN   No results found.   No diagnosis found.    MDM  Pt with flu-like symptoms  is outside the window for Tamiflu. Will check for strep pharyngitis given enlarged tonsils and adenopathy. APAP for fever.         Charles B. Bernette Mayers, MD 11/10/12 1520  Charles B. Bernette Mayers, MD 11/10/12 1536

## 2012-11-10 NOTE — ED Notes (Signed)
Pt reports a 3 day history of fever, nausea, vomiting, headache, generalized body aches and sore throat.  Symptoms are unrelieved after taking OTC medications.

## 2012-11-10 NOTE — ED Notes (Signed)
MD at bedside. 

## 2012-11-17 ENCOUNTER — Encounter (HOSPITAL_BASED_OUTPATIENT_CLINIC_OR_DEPARTMENT_OTHER): Payer: Self-pay | Admitting: *Deleted

## 2012-11-17 ENCOUNTER — Emergency Department (HOSPITAL_BASED_OUTPATIENT_CLINIC_OR_DEPARTMENT_OTHER)
Admission: EM | Admit: 2012-11-17 | Discharge: 2012-11-17 | Disposition: A | Discharge status: Home / Self Care | Payer: Self-pay | Attending: Emergency Medicine | Admitting: Emergency Medicine

## 2012-11-17 DIAGNOSIS — Z79899 Other long term (current) drug therapy: Secondary | ICD-10-CM | POA: Insufficient documentation

## 2012-11-17 DIAGNOSIS — H9209 Otalgia, unspecified ear: Secondary | ICD-10-CM | POA: Insufficient documentation

## 2012-11-17 DIAGNOSIS — J329 Chronic sinusitis, unspecified: Secondary | ICD-10-CM | POA: Insufficient documentation

## 2012-11-17 DIAGNOSIS — R51 Headache: Secondary | ICD-10-CM | POA: Insufficient documentation

## 2012-11-17 DIAGNOSIS — J029 Acute pharyngitis, unspecified: Secondary | ICD-10-CM | POA: Insufficient documentation

## 2012-11-17 DIAGNOSIS — R112 Nausea with vomiting, unspecified: Secondary | ICD-10-CM | POA: Insufficient documentation

## 2012-11-17 MED ORDER — AMOXICILLIN-POT CLAVULANATE 875-125 MG PO TABS: 1.0000 | ORAL_TABLET | Freq: Two times a day (BID) | ORAL | Status: DC

## 2012-11-17 NOTE — ED Provider Notes (Signed)
Medical screening examination/treatment/procedure(s) were performed by non-physician practitioner and as supervising physician I was immediately available for consultation/collaboration.  Gerhard Munch, MD 11/17/12 2348

## 2012-11-17 NOTE — ED Notes (Signed)
Pt. States she had a fever to 100.4 on 11/14/12, but none since.

## 2012-11-17 NOTE — ED Notes (Signed)
Cough diarrhea nausea vomiting. Symptoms x 3 weeks. Was seen last week and diagnosed with the flu.

## 2012-11-17 NOTE — ED Provider Notes (Signed)
History     CSN: [id number]  Arrival date & time 11/17/12  1959   None     Chief Complaint  Patient presents with  . Cough    (Consider location/radiation/quality/duration/timing/severity/associated sxs/prior treatment) Patient is a 37 y.o. female presenting with cough. The history is provided by the patient.  Cough Chronicity: Symptomatic with flu-like symptoms for 3 weeks. Treated last week here for flu/cough symptoms. Over last 3 days, increased headache, thick mucoid nasal discharge, cough, and pressure in ears. The problem has not changed since onset.The cough is non-productive. There has been no fever. Associated symptoms include ear pain, headaches, rhinorrhea and sore throat. Pertinent negatives include no chest pain and no eye redness. Associated symptoms comments: Complains of chest pain, back pain, throat pain secondary to coughing; feeling pressure bilateral ears; headache and pressure in forehead and around sinuses.. She has tried cough syrup for the symptoms. The treatment provided no relief. She is not a smoker. Her past medical history does not include bronchitis, pneumonia, bronchiectasis, COPD, emphysema or asthma.    History reviewed. No pertinent past medical history.  Past Surgical History  Procedure Date  . Tendon repair     left hand    No family history on file.  History  Substance Use Topics  . Smoking status: Never Smoker   . Smokeless tobacco: Not on file  . Alcohol Use: Yes     Comment: 2x/wk    OB History    Grav Para Term Preterm Abortions TAB SAB Ect Mult Living                  Review of Systems  Constitutional: Positive for activity change. Negative for fever and diaphoresis.  HENT: Positive for ear pain, congestion, sore throat, rhinorrhea and sinus pressure. Negative for nosebleeds, neck pain, neck stiffness, tinnitus and ear discharge.   Eyes: Negative for discharge, redness and visual disturbance.  Respiratory: Positive for cough.     Cardiovascular: Negative for chest pain and palpitations.  Gastrointestinal: Positive for nausea, vomiting and diarrhea. Negative for abdominal pain, constipation and abdominal distention.  Musculoskeletal: Positive for back pain and arthralgias. Negative for joint swelling.  Skin: Negative for pallor and rash.  Neurological: Positive for dizziness, light-headedness and headaches. Negative for weakness and numbness.  Psychiatric/Behavioral: Negative for confusion.  All other systems reviewed and are negative.    Allergies  Review of patient's allergies indicates no known allergies.  Home Medications   Current Outpatient Rx  Name  Route  Sig  Dispense  Refill  . BENZONATATE 100 MG PO CAPS   Oral   Take 1 capsule (100 mg total) by mouth every 8 (eight) hours.   21 capsule   0   . CETIRIZINE-PSEUDOEPHEDRINE ER 5-120 MG PO TB12   Oral   Take 1 tablet by mouth daily.   30 tablet   0   . HYDROCODONE-ACETAMINOPHEN 7.5-500 MG/15ML PO SOLN   Oral   Take 15 mLs by mouth every 6 (six) hours as needed for pain or cough.   120 mL   0   . NORETHINDRONE ACET-ETHINYL EST 1-20 MG-MCG PO TABS   Oral   Take 1 tablet by mouth daily.           Marland Kitchen OMEPRAZOLE MAGNESIUM 20 MG PO TBEC   Oral   Take 20 mg by mouth daily.           Marland Kitchen VALACYCLOVIR HCL 500 MG PO TABS   Oral  Take 500 mg by mouth daily.            BP 138/94  Pulse 86  Temp 98.3 F (36.8 C) (Oral)  Resp 20  SpO2 99%  LMP 11/06/2012  Physical Exam  Nursing note and vitals reviewed. Constitutional: She is oriented to person, place, and time. She appears well-developed and well-nourished. No distress.  HENT:  Head: Normocephalic and atraumatic.  Right Ear: Tympanic membrane and external ear normal. No drainage or tenderness. Tympanic membrane is not retracted and not bulging.  Left Ear: Tympanic membrane and external ear normal. No drainage or tenderness. Tympanic membrane is not retracted and not bulging.   Nose: Right sinus exhibits maxillary sinus tenderness and frontal sinus tenderness. Left sinus exhibits maxillary sinus tenderness and frontal sinus tenderness.  Mouth/Throat: No oropharyngeal exudate or tonsillar abscesses.  Cardiovascular: Normal rate and regular rhythm.   Pulmonary/Chest: Effort normal and breath sounds normal. Not tachypneic. No respiratory distress. She has no wheezes. She has no rales. She exhibits no tenderness.  Abdominal: Soft. Bowel sounds are normal. She exhibits no distension. There is no tenderness. There is no rebound and no guarding.  Musculoskeletal: Normal range of motion.  Neurological: She is alert and oriented to person, place, and time.  Skin: Skin is warm and dry. She is not diaphoretic.    ED Course  Procedures (including critical care time)  Labs Reviewed - No data to display No results found.  Dianosis: sinusitis  MDM  Pt symptomatic for 3 weeks complaining of sinus pressure (maxillary and frontal), ear pressure, non-productive cough, nausea, vomiting, and diarrhea. Seen last week and treated symptomatically for flu (outside the window for Tamiflu). Symptoms have not resolved, headache and sinus pressure increasing. Pt prescribed Augmentin and provided a list of providers to follow up with.  Glade Nurse, PA-C 11/17/12 2207

## 2013-01-18 ENCOUNTER — Encounter (HOSPITAL_BASED_OUTPATIENT_CLINIC_OR_DEPARTMENT_OTHER): Payer: Self-pay

## 2013-01-18 ENCOUNTER — Emergency Department (HOSPITAL_BASED_OUTPATIENT_CLINIC_OR_DEPARTMENT_OTHER)
Admission: EM | Admit: 2013-01-18 | Discharge: 2013-01-18 | Disposition: A | Discharge status: Home / Self Care | Payer: Self-pay | Attending: Emergency Medicine | Admitting: Emergency Medicine

## 2013-01-18 DIAGNOSIS — R059 Cough, unspecified: Secondary | ICD-10-CM | POA: Insufficient documentation

## 2013-01-18 DIAGNOSIS — J029 Acute pharyngitis, unspecified: Secondary | ICD-10-CM | POA: Insufficient documentation

## 2013-01-18 DIAGNOSIS — J3489 Other specified disorders of nose and nasal sinuses: Secondary | ICD-10-CM | POA: Insufficient documentation

## 2013-01-18 DIAGNOSIS — Z79899 Other long term (current) drug therapy: Secondary | ICD-10-CM | POA: Insufficient documentation

## 2013-01-18 DIAGNOSIS — M542 Cervicalgia: Secondary | ICD-10-CM | POA: Insufficient documentation

## 2013-01-18 DIAGNOSIS — R51 Headache: Secondary | ICD-10-CM | POA: Insufficient documentation

## 2013-01-18 DIAGNOSIS — J0100 Acute maxillary sinusitis, unspecified: Secondary | ICD-10-CM | POA: Insufficient documentation

## 2013-01-18 DIAGNOSIS — R05 Cough: Secondary | ICD-10-CM | POA: Insufficient documentation

## 2013-01-18 MED ORDER — AMOXICILLIN-POT CLAVULANATE 875-125 MG PO TABS: 1.0000 | ORAL_TABLET | Freq: Two times a day (BID) | ORAL | Status: DC

## 2013-01-18 NOTE — ED Notes (Signed)
Pt reports sinus/nasal congestion, ear pain and sore throat x 1 week unrelieved after taking Mucinex.

## 2013-01-18 NOTE — ED Provider Notes (Signed)
History     CSN: [id number]  Arrival date & time 01/18/13  1643   First MD Initiated Contact with Patient 01/18/13 1704      Chief Complaint  Patient presents with  . Otalgia  . Sore Throat  . Nasal Congestion    (Consider location/radiation/quality/duration/timing/severity/associated sxs/prior treatment) Patient is a 37 y.o. female presenting with ear pain and pharyngitis. The history is provided by the patient. No language interpreter was used.  Otalgia Location:  Bilateral Behind ear:  No abnormality Quality:  Pressure and dull (pt describes some cracking and popping) Severity:  Mild Onset quality:  Gradual Timing:  Constant Progression:  Worsening Chronicity:  New Context: not direct blow, not elevation change, not foreign body in ear, not loud noise and no water in ear   Relieved by:  Nothing Worsened by:  Coughing Ineffective treatments:  OTC medications Associated symptoms: congestion, cough, headaches, neck pain, rhinorrhea and sore throat   Associated symptoms: no abdominal pain, no ear discharge, no fever, no hearing loss and no vomiting   Congestion:    Location:  Nasal   Interferes with sleep: no     Congestion interferes with eating/drinking: able to eat and drink, but sore.   Cough:    Cough characteristics:  Non-productive   Sputum characteristics:  Clear   Severity:  Mild   Onset quality:  Gradual   Timing:  Sporadic   Progression:  Unable to specify   Chronicity:  New Headaches:    Severity:  Mild   Onset quality:  Gradual   Timing:  Unable to specify   Progression:  Waxing and waning   Chronicity:  New Rhinorrhea:    Quality:  Clear   Severity:  Mild   Timing:  Intermittent   Progression:  Waxing and waning Sore throat:    Severity:  Moderate   Onset quality:  Gradual   Timing:  Constant   Progression:  Worsening Risk factors: no recent travel, no chronic ear infection and no prior ear surgery   Sore Throat Associated symptoms include  congestion, coughing, headaches, neck pain and a sore throat. Pertinent negatives include no abdominal pain, chest pain, chills, diaphoresis, fever, nausea or vomiting.    History reviewed. No pertinent past medical history.  Past Surgical History  Procedure Laterality Date  . Tendon repair      left hand  . Hand tendon surgery      No family history on file.  History  Substance Use Topics  . Smoking status: Never Smoker   . Smokeless tobacco: Not on file  . Alcohol Use: Yes     Comment: 2x/wk    OB History   Grav Para Term Preterm Abortions TAB SAB Ect Mult Living                  Review of Systems  Constitutional: Negative for fever, chills and diaphoresis.  HENT: Positive for ear pain, congestion, sore throat, rhinorrhea and neck pain. Negative for hearing loss and ear discharge.   Respiratory: Positive for cough. Negative for chest tightness, shortness of breath and wheezing.   Cardiovascular: Negative for chest pain.  Gastrointestinal: Negative for nausea, vomiting and abdominal pain.  Neurological: Positive for headaches.  All other systems reviewed and are negative.    Allergies  Review of patient's allergies indicates no known allergies.  Home Medications   Current Outpatient Rx  Name  Route  Sig  Dispense  Refill  . omeprazole (PRILOSEC OTC) 20 MG  tablet   Oral   Take 20 mg by mouth daily.             BP 135/88  Pulse 98  Temp(Src) 98.5 F (36.9 C) (Oral)  Resp 16  Ht 5\' 10"  (1.778 m)  Wt 210 lb (95.255 kg)  BMI 30.13 kg/m2  SpO2 99%  LMP 12/31/2012  Physical Exam  Nursing note and vitals reviewed. Constitutional: She is oriented to person, place, and time. She appears well-developed and well-nourished. No distress.  HENT:  Head: Normocephalic and atraumatic.  Right Ear: External ear normal.  Left Ear: External ear normal.  Mouth/Throat: Posterior oropharyngeal edema and posterior oropharyngeal erythema present. No oropharyngeal exudate  or tonsillar abscesses.  Bilateral tonsillar hypertrophy, mild postnasal drip. Nose: erythremic turbinates, moist    Eyes: EOM are normal. Pupils are equal, round, and reactive to light.  Neck: Normal range of motion. Neck supple.  Cervical adenopathy R>L, mild tenderness  Cardiovascular: Normal rate, regular rhythm and normal heart sounds.   Pulmonary/Chest: Effort normal and breath sounds normal. No stridor. No respiratory distress. She has no wheezes. She has no rales. She exhibits no tenderness.  Abdominal: Soft. There is no tenderness.  Lymphadenopathy:    She has cervical adenopathy.  Neurological: She is alert and oriented to person, place, and time.  Skin: Skin is warm and dry. She is not diaphoretic.    ED Course  Procedures (including critical care time)  Labs Reviewed - No data to display No results found.   1. Sinusitis, acute maxillary       MDM  Pt was prescribed Augmentin and advised to use an OTC decongestant and Afrin for 2 days to help with nasal congestion.  Advised to follow up with Dr. Jearld Fenton if she continues to have sinus problems.          Junius Finner, PA-C 01/18/13 1745

## 2013-01-20 NOTE — ED Provider Notes (Signed)
Medical screening examination/treatment/procedure(s) were performed by non-physician practitioner and as supervising physician I was immediately available for consultation/collaboration.   Shelda Jakes, MD 01/20/13 [phone]

## 2013-02-13 ENCOUNTER — Emergency Department (HOSPITAL_BASED_OUTPATIENT_CLINIC_OR_DEPARTMENT_OTHER)
Admission: EM | Admit: 2013-02-13 | Discharge: 2013-02-13 | Disposition: A | Discharge status: Home / Self Care | Payer: Self-pay | Attending: Emergency Medicine | Admitting: Emergency Medicine

## 2013-02-13 ENCOUNTER — Encounter (HOSPITAL_BASED_OUTPATIENT_CLINIC_OR_DEPARTMENT_OTHER): Payer: Self-pay | Admitting: Student

## 2013-02-13 DIAGNOSIS — R110 Nausea: Secondary | ICD-10-CM | POA: Insufficient documentation

## 2013-02-13 DIAGNOSIS — Z79899 Other long term (current) drug therapy: Secondary | ICD-10-CM | POA: Insufficient documentation

## 2013-02-13 DIAGNOSIS — J329 Chronic sinusitis, unspecified: Secondary | ICD-10-CM | POA: Insufficient documentation

## 2013-02-13 DIAGNOSIS — M542 Cervicalgia: Secondary | ICD-10-CM | POA: Insufficient documentation

## 2013-02-13 DIAGNOSIS — IMO0001 Reserved for inherently not codable concepts without codable children: Secondary | ICD-10-CM | POA: Insufficient documentation

## 2013-02-13 DIAGNOSIS — J3489 Other specified disorders of nose and nasal sinuses: Secondary | ICD-10-CM | POA: Insufficient documentation

## 2013-02-13 MED ORDER — AMOXICILLIN-POT CLAVULANATE 875-125 MG PO TABS: 1.0000 | ORAL_TABLET | Freq: Two times a day (BID) | ORAL | Status: DC

## 2013-02-13 NOTE — ED Provider Notes (Signed)
History     CSN: [id number]  Arrival date & time 02/13/13  0802   First MD Initiated Contact with Patient 02/13/13 [phone]      Chief Complaint  Patient presents with  . Migraine  . Neck Pain    (Consider location/radiation/quality/duration/timing/severity/associated sxs/prior treatment) Patient is a 37 y.o. female presenting with migraines and neck pain.  Migraine  Neck Pain  Pt with history of sinusitis with several recent visits for same finished a 10 day course of Augmentin about 2 weeks ago. Nasal congestion, sinus pressure and frontal headache returned several days ago, associated with nausea but no fever. Not improved with home meds including Zyrtec-D and Mucinex. She reports some myalgias/neck stiffness this AM. She has had some sensitivity to light today as well. No history of migraines, not the worst headache of her life and not sudden in onset.   History reviewed. No pertinent past medical history.  Past Surgical History  Procedure Laterality Date  . Tendon repair      left hand  . Hand tendon surgery      History reviewed. No pertinent family history.  History  Substance Use Topics  . Smoking status: Never Smoker   . Smokeless tobacco: Not on file  . Alcohol Use: Yes     Comment: 2x/wk    OB History   Grav Para Term Preterm Abortions TAB SAB Ect Mult Living                  Review of Systems  HENT: Positive for neck pain.    All other systems reviewed and are negative except as noted in HPI.   Allergies  Review of patient's allergies indicates no known allergies.  Home Medications   Current Outpatient Rx  Name  Route  Sig  Dispense  Refill  . omeprazole (PRILOSEC OTC) 20 MG tablet   Oral   Take 20 mg by mouth daily.             BP 138/107  Pulse 77  Temp(Src) 98.5 F (36.9 C) (Oral)  Resp 20  Wt 210 lb (95.255 kg)  BMI 30.13 kg/m2  SpO2 97%  LMP 12/31/2012  Physical Exam  Nursing note and vitals reviewed. Constitutional: She is  oriented to person, place, and time. She appears well-developed and well-nourished.  HENT:  Head: Normocephalic and atraumatic.  Eyes: EOM are normal. Pupils are equal, round, and reactive to light.  Neck: Normal range of motion. Neck supple.  Soft tissue soreness, but no meningismus  Cardiovascular: Normal rate, normal heart sounds and intact distal pulses.   Pulmonary/Chest: Effort normal and breath sounds normal.  Abdominal: Bowel sounds are normal. She exhibits no distension. There is no tenderness.  Musculoskeletal: Normal range of motion. She exhibits no edema and no tenderness.  Lymphadenopathy:    She has no cervical adenopathy.  Neurological: She is alert and oriented to person, place, and time. She has normal strength. No cranial nerve deficit or sensory deficit.  Skin: Skin is warm and dry. No rash noted.  Psychiatric: She has a normal mood and affect.    ED Course  Procedures (including critical care time)  Labs Reviewed - No data to display No results found.   No diagnosis found.    MDM  Pt with return of sinus symptoms, likely a chronic sinusitis at this point. She was offered parenteral analgesics and antiemetics but refused. Will treat with a long course of Augmentin and referral to ENT. No concern  for more serious cause of symptoms such as SAH or bacterial meningitis.         Margarita Bobrowski B. Bernette Mayers, MD 02/13/13 [phone]

## 2013-02-13 NOTE — ED Notes (Signed)
Pt in with c/o frontal based headache with associated sinus pressure and pain. Reports pain and pressure unrelieved by ibuprofen, mucinex, and zyrtec. Reports onset of neck on left side on neck and middle of neck. Denies visual issues, but does reports N with headache.

## 2013-08-25 ENCOUNTER — Encounter (HOSPITAL_BASED_OUTPATIENT_CLINIC_OR_DEPARTMENT_OTHER): Payer: Self-pay | Admitting: Emergency Medicine

## 2013-08-25 ENCOUNTER — Emergency Department (HOSPITAL_BASED_OUTPATIENT_CLINIC_OR_DEPARTMENT_OTHER)
Admission: EM | Admit: 2013-08-25 | Discharge: 2013-08-25 | Disposition: A | Discharge status: Home / Self Care | Payer: Medicaid Other | Attending: Emergency Medicine | Admitting: Emergency Medicine

## 2013-08-25 DIAGNOSIS — Z3202 Encounter for pregnancy test, result negative: Secondary | ICD-10-CM | POA: Insufficient documentation

## 2013-08-25 DIAGNOSIS — Z79899 Other long term (current) drug therapy: Secondary | ICD-10-CM | POA: Insufficient documentation

## 2013-08-25 DIAGNOSIS — R112 Nausea with vomiting, unspecified: Secondary | ICD-10-CM | POA: Insufficient documentation

## 2013-08-25 LAB — PREGNANCY, URINE: Preg Test, Ur: NEGATIVE

## 2013-08-25 MED ORDER — ONDANSETRON 8 MG PO TBDP: 8.0000 mg | ORAL_TABLET | Freq: Three times a day (TID) | ORAL | Status: DC | PRN

## 2013-08-25 MED ORDER — ONDANSETRON 8 MG PO TBDP
8.0000 mg | ORAL_TABLET | Freq: Once | ORAL | Status: AC
  Administered 2013-08-25: 8 mg via ORAL
  Filled 2013-08-25: qty 1

## 2013-08-25 NOTE — ED Notes (Signed)
Pt reports that on Wed she started having nausea/vomitng and today dizziness.

## 2013-08-25 NOTE — ED Provider Notes (Signed)
CSN: [id number]     Arrival date & time 08/25/13  1042 History   First MD Initiated Contact with Patient 08/25/13 1131     Chief Complaint  Patient presents with  . Dizziness   (Consider location/radiation/quality/duration/timing/severity/associated sxs/prior Treatment) HPI 37 y.o. Female with nausea and vomiting began two days ago.  She vomited multiple times day one then dry heaves yesterday.  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  She denies headache or abdominal pain before symptoms but states began having a headache yesterday and had some right side abdominal pain after vomiting.  No fever, chills, or diarrhea.  States she has had gastric reflux but no symptoms like this.  Denies sick contacts or other food intake. Recently began new bcp last month.  PMD  Dr. Maurice March at ob office.   History reviewed. No pertinent past medical history. Past Surgical History  Procedure Laterality Date  . Tendon repair      left hand  . Hand tendon surgery     History reviewed. No pertinent family history. History  Substance Use Topics  . Smoking status: Never Smoker   . Smokeless tobacco: Not on file  . Alcohol Use: Yes     Comment: 2x/wk   OB History   Grav Para Term Preterm Abortions TAB SAB Ect Mult Living                 Review of Systems  All other systems reviewed and are negative.    Allergies  Review of patient's allergies indicates no known allergies.  Home Medications   Current Outpatient Rx  Name  Route  Sig  Dispense  Refill  . amoxicillin-clavulanate (AUGMENTIN) 875-125 MG per tablet   Oral   Take 1 tablet by mouth every 12 (twelve) hours.   42 tablet   0   . omeprazole (PRILOSEC OTC) 20 MG tablet   Oral   Take 20 mg by mouth daily.            BP 143/81  Pulse 70  Temp(Src) 98.7 F (37.1 C) (Oral)  SpO2 100%  LMP 08/11/2013 Physical Exam  Nursing note and vitals reviewed. Constitutional: She is  oriented to person, place, and time. She appears well-developed and well-nourished.  HENT:  Head: Normocephalic and atraumatic.  Right Ear: External ear normal.  Left Ear: External ear normal.  Nose: Nose normal.  Mouth/Throat: Oropharynx is clear and moist.  Eyes: Conjunctivae are normal. Pupils are equal, round, and reactive to light.  Neck: Normal range of motion. Neck supple.  Cardiovascular: Normal rate, regular rhythm, normal heart sounds and intact distal pulses.   Pulmonary/Chest: Effort normal and breath sounds normal.  Abdominal: Soft. Bowel sounds are normal.  Musculoskeletal: Normal range of motion.  Neurological: She is alert and oriented to person, place, and time. She has normal reflexes. She displays normal reflexes. No cranial nerve deficit. Coordination normal.  Skin: Skin is warm and dry.  Psychiatric: She has a normal mood and affect. Her behavior is normal. Judgment and thought content normal.    ED Course  Procedures (including critical care time) Labs Review Labs Reviewed  PREGNANCY, URINE   Imaging Review No results found.  EKG Interpretation   None       MDM  No diagnosis found. 37 year old female with several day hisotry of nausea and vomting.  She has a normal exam here and no tenderness  of abdomen.  She was given po fluid without vomiting here.  Patient advised to return if any pain, fever, or return of vomiting.     Hilario Quarry, MD 08/29/13 [phone]

## 2013-10-23 ENCOUNTER — Emergency Department (HOSPITAL_BASED_OUTPATIENT_CLINIC_OR_DEPARTMENT_OTHER)
Admission: EM | Admit: 2013-10-23 | Discharge: 2013-10-23 | Disposition: A | Discharge status: Home / Self Care | Payer: Medicaid Other | Attending: Emergency Medicine | Admitting: Emergency Medicine

## 2013-10-23 ENCOUNTER — Encounter (HOSPITAL_BASED_OUTPATIENT_CLINIC_OR_DEPARTMENT_OTHER): Payer: Self-pay | Admitting: Emergency Medicine

## 2013-10-23 DIAGNOSIS — Z79899 Other long term (current) drug therapy: Secondary | ICD-10-CM | POA: Insufficient documentation

## 2013-10-23 DIAGNOSIS — Z8619 Personal history of other infectious and parasitic diseases: Secondary | ICD-10-CM | POA: Insufficient documentation

## 2013-10-23 DIAGNOSIS — K219 Gastro-esophageal reflux disease without esophagitis: Secondary | ICD-10-CM | POA: Insufficient documentation

## 2013-10-23 DIAGNOSIS — Z3202 Encounter for pregnancy test, result negative: Secondary | ICD-10-CM | POA: Insufficient documentation

## 2013-10-23 HISTORY — DX: Herpesviral infection of urogenital system, unspecified: A60.00

## 2013-10-23 LAB — URINALYSIS, ROUTINE W REFLEX MICROSCOPIC
Bilirubin Urine: NEGATIVE
Glucose, UA: NEGATIVE mg/dL
Ketones, ur: NEGATIVE mg/dL
Protein, ur: NEGATIVE mg/dL

## 2013-10-23 LAB — CBC WITH DIFFERENTIAL/PLATELET
Eosinophils Absolute: 0.1 10*3/uL (ref 0.0–0.7)
Eosinophils Relative: 2 % (ref 0–5)
HCT: 36 % (ref 36.0–46.0)
Lymphs Abs: 2.3 10*3/uL (ref 0.7–4.0)
MCH: 24.3 pg — ABNORMAL LOW (ref 26.0–34.0)
MCV: 74.8 fL — ABNORMAL LOW (ref 78.0–100.0)
Monocytes Absolute: 0.4 10*3/uL (ref 0.1–1.0)
Monocytes Relative: 6 % (ref 3–12)
Platelets: 420 10*3/uL — ABNORMAL HIGH (ref 150–400)
RBC: 4.81 MIL/uL (ref 3.87–5.11)

## 2013-10-23 LAB — COMPREHENSIVE METABOLIC PANEL
Alkaline Phosphatase: 50 U/L (ref 39–117)
BUN: 11 mg/dL (ref 6–23)
CO2: 27 mEq/L (ref 19–32)
Chloride: 100 mEq/L (ref 96–112)
Creatinine, Ser: 0.7 mg/dL (ref 0.50–1.10)
GFR calc non Af Amer: >90 mL/min (ref >90)
Potassium: 3.5 mEq/L (ref 3.5–5.1)
Total Bilirubin: 0.4 mg/dL (ref 0.3–1.2)

## 2013-10-23 LAB — URINE MICROSCOPIC-ADD ON

## 2013-10-23 MED ORDER — GI COCKTAIL ~~LOC~~
30.0000 mL | Freq: Once | ORAL | Status: AC
  Administered 2013-10-23: 30 mL via ORAL
  Filled 2013-10-23: qty 30

## 2013-10-23 MED ORDER — PANTOPRAZOLE SODIUM 40 MG IV SOLR
40.0000 mg | Freq: Once | INTRAVENOUS | Status: AC
  Administered 2013-10-23: 40 mg via INTRAVENOUS
  Filled 2013-10-23: qty 40

## 2013-10-23 MED ORDER — ONDANSETRON 4 MG PO TBDP: 4.0000 mg | ORAL_TABLET | Freq: Three times a day (TID) | ORAL | Status: DC | PRN

## 2013-10-23 MED ORDER — PANTOPRAZOLE SODIUM 20 MG PO TBEC: 20.0000 mg | DELAYED_RELEASE_TABLET | Freq: Every day | ORAL | Status: DC

## 2013-10-23 MED ORDER — SODIUM CHLORIDE 0.9 % IV BOLUS (SEPSIS)
1000.0000 mL | Freq: Once | INTRAVENOUS | Status: AC
  Administered 2013-10-23: 1000 mL via INTRAVENOUS

## 2013-10-23 NOTE — ED Provider Notes (Signed)
Medical screening examination/treatment/procedure(s) were performed by non-physician practitioner and as supervising physician I was immediately available for consultation/collaboration.  EKG Interpretation    Date/Time:  Monday October 23 2013 09:26:20 EST Ventricular Rate:  72 PR Interval:  148 QRS Duration: 80 QT Interval:  400 QTC Calculation: 438 R Axis:   75 Text Interpretation:  Normal sinus rhythm Normal ECG No old tracing to compare Confirmed by [facility]  MD, TREY ([id number]) on 10/23/2013 10:35:34 AM              Candyce Churn, MD 10/23/13 1414

## 2013-10-23 NOTE — ED Notes (Addendum)
RT at bedside for lab work. 

## 2013-10-23 NOTE — ED Notes (Signed)
Pt reports epigastric pain that started Friday.  Reports pain feels like heartburn, her throat is on fire , and she has vomited all weekend.  States she is unable to keep anything down and Prilosec and Zantac are not effective.

## 2013-10-23 NOTE — ED Provider Notes (Signed)
CSN: [id number]     Arrival date & time 10/23/13  0840 History   First MD Initiated Contact with Patient 10/23/13 0902     Chief Complaint  Patient presents with  . Abdominal Pain  . Emesis   (Consider location/radiation/quality/duration/timing/severity/associated sxs/prior Treatment) HPI Comments: Pt state that she has a bad history of gerd and she started having problems 3 days ago:pt states that the otc medication have not helped:now her throat is hurting from so much vomiting  Patient is a 37 y.o. female presenting with abdominal pain and vomiting. The history is provided by the patient. No language interpreter was used.  Abdominal Pain Pain location:  Epigastric Pain quality: aching and burning   Pain severity:  Moderate Timing:  Constant Relieved by:  Nothing Ineffective treatments: prilosec and zantac. Associated symptoms: nausea and vomiting   Associated symptoms: no chest pain and no diarrhea   Emesis Associated symptoms: abdominal pain   Associated symptoms: no diarrhea     Past Medical History  Diagnosis Date  . Genital herpes    Past Surgical History  Procedure Laterality Date  . Tendon repair      left hand  . Hand tendon surgery     No family history on file. History  Substance Use Topics  . Smoking status: Never Smoker   . Smokeless tobacco: Not on file  . Alcohol Use: Yes     Comment: 2x/wk   OB History   Grav Para Term Preterm Abortions TAB SAB Ect Mult Living                 Review of Systems  Constitutional: Negative.   Respiratory: Negative.   Cardiovascular: Negative for chest pain.  Gastrointestinal: Positive for nausea, vomiting and abdominal pain. Negative for diarrhea.    Allergies  Review of patient's allergies indicates no known allergies.  Home Medications   Current Outpatient Rx  Name  Route  Sig  Dispense  Refill  . Norgestimate-Ethinyl Estradiol Triphasic (ORTHO TRI-CYCLEN LO) 0.18/0.215/0.25 MG-25 MCG tab   Oral   Take 1  tablet by mouth daily.         . Ranitidine HCl (ZANTAC PO)   Oral   Take by mouth as needed.         . valACYclovir (VALTREX) 1000 MG tablet   Oral   Take 1,000 mg by mouth 2 (two) times daily.         Marland Kitchen amoxicillin-clavulanate (AUGMENTIN) 875-125 MG per tablet   Oral   Take 1 tablet by mouth every 12 (twelve) hours.   42 tablet   0   . omeprazole (PRILOSEC OTC) 20 MG tablet   Oral   Take 20 mg by mouth daily.           . ondansetron (ZOFRAN ODT) 8 MG disintegrating tablet   Oral   Take 1 tablet (8 mg total) by mouth every 8 (eight) hours as needed for nausea.   20 tablet   0    BP 131/84  Pulse 82  Temp(Src) 98.2 F (36.8 C) (Oral)  Resp 18  Ht 5\' 10"  (1.778 m)  Wt 220 lb (99.791 kg)  BMI 31.57 kg/m2  SpO2 100%  LMP 10/13/2013 Physical Exam  Nursing note and vitals reviewed. Constitutional: She appears well-developed and well-nourished.  HENT:  Head: Normocephalic and atraumatic.  Eyes: Conjunctivae and EOM are normal.  Neck: Normal range of motion. Neck supple.  Cardiovascular: Normal rate and regular rhythm.   Pulmonary/Chest:  Effort normal and breath sounds normal.  Abdominal: Soft. There is tenderness in the epigastric area.  Musculoskeletal: Normal range of motion.  Neurological: She is alert.  Skin: Skin is warm and dry.    ED Course  Procedures (including critical care time) Labs Review Labs Reviewed  CBC WITH DIFFERENTIAL - Abnormal; Notable for the following:    Hemoglobin 11.7 (*)    MCV 74.8 (*)    MCH 24.3 (*)    Platelets 420 (*)    All other components within normal limits  URINALYSIS, ROUTINE W REFLEX MICROSCOPIC - Abnormal; Notable for the following:    Color, Urine AMBER (*)    APPearance CLOUDY (*)    Hgb urine dipstick TRACE (*)    All other components within normal limits  COMPREHENSIVE METABOLIC PANEL - Abnormal; Notable for the following:    Glucose, Bld 115 (*)    Total Protein 9.1 (*)    All other components  within normal limits  URINE MICROSCOPIC-ADD ON - Abnormal; Notable for the following:    Squamous Epithelial / LPF FEW (*)    Bacteria, UA MANY (*)    All other components within normal limits  PREGNANCY, URINE   Imaging Review No results found.  EKG Interpretation    Date/Time:  Monday October 23 2013 09:26:20 EST Ventricular Rate:  72 PR Interval:  148 QRS Duration: 80 QT Interval:  400 QTC Calculation: 438 R Axis:   75 Text Interpretation:  Normal sinus rhythm Normal ECG No old tracing to compare Confirmed by [facility]  MD, TREY ([id number]) on 10/23/2013 10:35:34 AM            MDM   1. GERD (gastroesophageal reflux disease)    Pt is feeling a lot better at this time:will send home on protonix:pt is keeping down po at this time:don't think imaging is needed at this time:pt can follow up with gi as needed:doubt cardiac in nature    Teressa Lower, NP 10/23/13 1040

## 2013-11-16 NOTE — L&D Delivery Note (Signed)
Delivery Note At 9:21 PM a viable female was delivered via Vaginal, Spontaneous Delivery (Presentation: Right Occiput Anterior).  APGAR: 8, 9; weight 6 lb 11 oz (3033 g).   Placenta status: Intact, Spontaneous.  Cord: 3 vessels with the following complications: None.  Cord pH: n/a  Anesthesia: Epidural  Episiotomy: None Lacerations: 2nd degree Suture Repair: 2.0 vicryl Est. Blood Loss (mL): 300  Mom to postpartum.  Baby to Couplet care / Skin to Skin.  Dimas Chyle 09/03/2014, 12:51 AM

## 2013-11-16 NOTE — L&D Delivery Note (Signed)
Agree with assessment. 

## 2014-01-12 ENCOUNTER — Encounter (HOSPITAL_BASED_OUTPATIENT_CLINIC_OR_DEPARTMENT_OTHER): Payer: Self-pay | Admitting: Emergency Medicine

## 2014-01-12 ENCOUNTER — Emergency Department (HOSPITAL_BASED_OUTPATIENT_CLINIC_OR_DEPARTMENT_OTHER)
Admission: EM | Admit: 2014-01-12 | Discharge: 2014-01-12 | Disposition: A | Discharge status: Home / Self Care | Payer: Medicaid Other | Attending: Emergency Medicine | Admitting: Emergency Medicine

## 2014-01-12 ENCOUNTER — Emergency Department (HOSPITAL_BASED_OUTPATIENT_CLINIC_OR_DEPARTMENT_OTHER): Payer: Medicaid Other

## 2014-01-12 DIAGNOSIS — O2390 Unspecified genitourinary tract infection in pregnancy, unspecified trimester: Secondary | ICD-10-CM | POA: Insufficient documentation

## 2014-01-12 DIAGNOSIS — O9989 Other specified diseases and conditions complicating pregnancy, childbirth and the puerperium: Secondary | ICD-10-CM | POA: Insufficient documentation

## 2014-01-12 DIAGNOSIS — Z79899 Other long term (current) drug therapy: Secondary | ICD-10-CM | POA: Insufficient documentation

## 2014-01-12 DIAGNOSIS — B9689 Other specified bacterial agents as the cause of diseases classified elsewhere: Secondary | ICD-10-CM | POA: Insufficient documentation

## 2014-01-12 DIAGNOSIS — N760 Acute vaginitis: Secondary | ICD-10-CM | POA: Insufficient documentation

## 2014-01-12 DIAGNOSIS — A499 Bacterial infection, unspecified: Secondary | ICD-10-CM | POA: Insufficient documentation

## 2014-01-12 DIAGNOSIS — R110 Nausea: Secondary | ICD-10-CM | POA: Insufficient documentation

## 2014-01-12 DIAGNOSIS — O219 Vomiting of pregnancy, unspecified: Secondary | ICD-10-CM | POA: Insufficient documentation

## 2014-01-12 DIAGNOSIS — Z3490 Encounter for supervision of normal pregnancy, unspecified, unspecified trimester: Secondary | ICD-10-CM

## 2014-01-12 DIAGNOSIS — K219 Gastro-esophageal reflux disease without esophagitis: Secondary | ICD-10-CM | POA: Insufficient documentation

## 2014-01-12 DIAGNOSIS — R109 Unspecified abdominal pain: Secondary | ICD-10-CM | POA: Insufficient documentation

## 2014-01-12 DIAGNOSIS — R197 Diarrhea, unspecified: Secondary | ICD-10-CM | POA: Insufficient documentation

## 2014-01-12 HISTORY — DX: Gastro-esophageal reflux disease without esophagitis: K21.9

## 2014-01-12 LAB — WET PREP, GENITAL
TRICH WET PREP: NONE SEEN
WBC, Wet Prep HPF POC: NONE SEEN
YEAST WET PREP: NONE SEEN

## 2014-01-12 LAB — CBC WITH DIFFERENTIAL/PLATELET
Basophils Absolute: 0.1 10*3/uL (ref 0.0–0.1)
Basophils Relative: 1 % (ref 0–1)
EOS ABS: 0.1 10*3/uL (ref 0.0–0.7)
Eosinophils Relative: 2 % (ref 0–5)
HCT: 33.5 % — ABNORMAL LOW (ref 36.0–46.0)
Hemoglobin: 10.8 g/dL — ABNORMAL LOW (ref 12.0–15.0)
Lymphocytes Relative: 39 % (ref 12–46)
Lymphs Abs: 2.4 10*3/uL (ref 0.7–4.0)
MCH: 23.8 pg — AB (ref 26.0–34.0)
MCHC: 32.2 g/dL (ref 30.0–36.0)
MCV: 73.8 fL — ABNORMAL LOW (ref 78.0–100.0)
Monocytes Absolute: 0.4 10*3/uL (ref 0.1–1.0)
Monocytes Relative: 7 % (ref 3–12)
NEUTROS PCT: 51 % (ref 43–77)
Neutro Abs: 3.1 10*3/uL (ref 1.7–7.7)
PLATELETS: 393 10*3/uL (ref 150–400)
RBC: 4.54 MIL/uL (ref 3.87–5.11)
RDW: 15 % (ref 11.5–15.5)
WBC: 6.1 10*3/uL (ref 4.0–10.5)

## 2014-01-12 LAB — BASIC METABOLIC PANEL
BUN: 9 mg/dL (ref 6–23)
CALCIUM: 9.5 mg/dL (ref 8.4–10.5)
CO2: 23 mEq/L (ref 19–32)
Chloride: 101 mEq/L (ref 96–112)
Creatinine, Ser: 0.6 mg/dL (ref 0.50–1.10)
GFR calc non Af Amer: >90 mL/min (ref >90)
GLUCOSE: 96 mg/dL (ref 70–99)
Potassium: 3.7 mEq/L (ref 3.7–5.3)
SODIUM: 139 meq/L (ref 137–147)

## 2014-01-12 LAB — URINALYSIS, ROUTINE W REFLEX MICROSCOPIC
Bilirubin Urine: NEGATIVE
GLUCOSE, UA: NEGATIVE mg/dL
KETONES UR: 15 mg/dL — AB
Nitrite: NEGATIVE
PROTEIN: 30 mg/dL — AB
Specific Gravity, Urine: 1.023 (ref 1.005–1.030)
Urobilinogen, UA: 1 mg/dL (ref 0.0–1.0)
pH: 6 (ref 5.0–8.0)

## 2014-01-12 LAB — URINE MICROSCOPIC-ADD ON

## 2014-01-12 LAB — HCG, QUANTITATIVE, PREGNANCY: hCG, Beta Chain, Quant, S: 32301 m[IU]/mL — ABNORMAL HIGH (ref <5)

## 2014-01-12 LAB — PREGNANCY, URINE: Preg Test, Ur: POSITIVE — AB

## 2014-01-12 MED ORDER — METRONIDAZOLE 500 MG PO TABS: 500.0000 mg | ORAL_TABLET | Freq: Two times a day (BID) | ORAL | Status: DC

## 2014-01-12 MED ORDER — PRENATAL 27-0.8 MG PO TABS: 1.0000 | ORAL_TABLET | Freq: Every day | ORAL | Status: DC

## 2014-01-12 MED ORDER — SODIUM CHLORIDE 0.9 % IV BOLUS (SEPSIS)
1000.0000 mL | Freq: Once | INTRAVENOUS | Status: AC
  Administered 2014-01-12: 1000 mL via INTRAVENOUS

## 2014-01-12 MED ORDER — ONDANSETRON 4 MG PO TBDP: 4.0000 mg | ORAL_TABLET | Freq: Three times a day (TID) | ORAL | Status: DC | PRN

## 2014-01-12 MED ORDER — ONDANSETRON HCL 4 MG/2ML IJ SOLN
4.0000 mg | Freq: Once | INTRAMUSCULAR | Status: AC
  Administered 2014-01-12: 4 mg via INTRAVENOUS
  Filled 2014-01-12: qty 2

## 2014-01-12 MED ORDER — ONDANSETRON HCL 4 MG/2ML IJ SOLN
INTRAMUSCULAR | Status: AC
  Filled 2014-01-12: qty 2

## 2014-01-12 NOTE — Discharge Instructions (Signed)
Bacterial Vaginosis Bacterial vaginosis is a vaginal infection that occurs when the normal balance of bacteria in the vagina is disrupted. It results from an overgrowth of certain bacteria. This is the most common vaginal infection in women of childbearing age. Treatment is important to prevent complications, especially in pregnant women, as it can cause a premature delivery. CAUSES  Bacterial vaginosis is caused by an increase in harmful bacteria that are normally present in smaller amounts in the vagina. Several different kinds of bacteria can cause bacterial vaginosis. However, the reason that the condition develops is not fully understood. RISK FACTORS Certain activities or behaviors can put you at an increased risk of developing bacterial vaginosis, including:  Having a new sex partner or multiple sex partners.  Douching.  Using an intrauterine device (IUD) for contraception. Women do not get bacterial vaginosis from toilet seats, bedding, swimming pools, or contact with objects around them. SIGNS AND SYMPTOMS  Some women with bacterial vaginosis have no signs or symptoms. Common symptoms include:  Grey vaginal discharge.  A fishlike odor with discharge, especially after sexual intercourse.  Itching or burning of the vagina and vulva.  Burning or pain with urination. DIAGNOSIS  Your health care provider will take a medical history and examine the vagina for signs of bacterial vaginosis. A sample of vaginal fluid may be taken. Your health care provider will look at this sample under a microscope to check for bacteria and abnormal cells. A vaginal pH test may also be done.  TREATMENT  Bacterial vaginosis may be treated with antibiotic medicines. These may be given in the form of a pill or a vaginal cream. A second round of antibiotics may be prescribed if the condition comes back after treatment.  HOME CARE INSTRUCTIONS   Only take over-the-counter or prescription medicines as  directed by your health care provider.  If antibiotic medicine was prescribed, take it as directed. Make sure you finish it even if you start to feel better.  Do not have sex until treatment is completed.  Tell all sexual partners that you have a vaginal infection. They should see their health care provider and be treated if they have problems, such as a mild rash or itching.  Practice safe sex by using condoms and only having one sex partner. SEEK MEDICAL CARE IF:   Your symptoms are not improving after 3 days of treatment.  You have increased discharge or pain.  You have a fever. MAKE SURE YOU:   Understand these instructions.  Will watch your condition.  Will get help right away if you are not doing well or get worse. FOR MORE INFORMATION  Centers for Disease Control and Prevention, Division of STD Prevention: [employer] American Sexual Health Association (ASHA): www.ashastd.org  Document Released: 11/02/2005 Document Revised: 08/23/2013 Document Reviewed: 06/14/2013 [facility] Patient Information 2014 [place].  Morning Sickness Morning sickness is when you feel sick to your stomach (nauseous) during pregnancy. This nauseous feeling may or may not come with vomiting. It often occurs in the morning but can be a problem any time of day. Morning sickness is most common during the first trimester, but it may continue throughout pregnancy. While morning sickness is unpleasant, it is usually harmless unless you develop severe and continual vomiting (hyperemesis gravidarum). This condition requires more intense treatment.  CAUSES  The cause of morning sickness is not completely known but seems to be related to normal hormonal changes that occur in pregnancy. RISK FACTORS You are at greater risk if you:  Experienced nausea or vomiting before your pregnancy.  Had morning sickness during a previous pregnancy.  Are pregnant with more than one baby, such as twins. TREATMENT   Do not use any medicines (prescription, over-the-counter, or herbal) for morning sickness without first talking to your health care provider. Your health care provider may prescribe or recommend:  Vitamin B6 supplements.  Anti-nausea medicines.  The herbal medicine ginger. HOME CARE INSTRUCTIONS   Only take over-the-counter or prescription medicines as directed by your health care provider.  Taking multivitamins before getting pregnant can prevent or decrease the severity of morning sickness in most women.   Eat a piece of dry toast or unsalted crackers before getting out of bed in the morning.   Eat five or six small meals a day.   Eat dry and bland foods (rice, baked potato). Foods high in carbohydrates are often helpful.  Do not drink liquids with your meals. Drink liquids between meals.   Avoid greasy, fatty, and spicy foods.   Get someone to cook for you if the smell of any food causes nausea and vomiting.   If you feel nauseous after taking prenatal vitamins, take the vitamins at night or with a snack.  Snack on protein foods (nuts, yogurt, cheese) between meals if you are hungry.   Eat unsweetened gelatins for desserts.   Wearing an acupressure wristband (worn for sea sickness) may be helpful.   Acupuncture may be helpful.   Do not smoke.   Get a humidifier to keep the air in your house free of odors.   Get plenty of fresh air. SEEK MEDICAL CARE IF:   Your home remedies are not working, and you need medicine.  You feel dizzy or lightheaded.  You are losing weight. SEEK IMMEDIATE MEDICAL CARE IF:   You have persistent and uncontrolled nausea and vomiting.  You pass out (faint). Document Released: 12/24/2006 Document Revised: 07/05/2013 Document Reviewed: 04/19/2013 [facility] Patient Information 2014 [place].

## 2014-01-12 NOTE — ED Provider Notes (Signed)
CSN: [id number]     Arrival date & time 01/12/14  1154 History   First MD Initiated Contact with Patient 01/12/14 1222     Chief Complaint  Patient presents with  . Emesis     (Consider location/radiation/quality/duration/timing/severity/associated sxs/prior Treatment) Patient is a 38 y.o. female presenting with vomiting.  Emesis  Pt with history of GERD reports 5 days of persistent nausea and vomiting, occasional diarrhea and lower abdominal cramping. Denies any fever, no blood in emesis or diarrhea. She reports some vaginal discharge but no dysuria or vaginal bleeding. She is approx 2 weeks late for anticipated menses. No prior pregnancies or abortions. She has been using OCP and condoms with sexual partner.   Past Medical History  Diagnosis Date  . Genital herpes   . Acid reflux    Past Surgical History  Procedure Laterality Date  . Tendon repair      left hand  . Hand tendon surgery     No family history on file. History  Substance Use Topics  . Smoking status: Never Smoker   . Smokeless tobacco: Not on file  . Alcohol Use: Yes     Comment: 2x/wk   OB History   Grav Para Term Preterm Abortions TAB SAB Ect Mult Living                 Review of Systems  Gastrointestinal: Positive for vomiting.   All other systems reviewed and are negative except as noted in HPI.     Allergies  Review of patient's allergies indicates no known allergies.  Home Medications   Current Outpatient Rx  Name  Route  Sig  Dispense  Refill  . esomeprazole (NEXIUM) 10 MG packet   Oral   Take 10 mg by mouth daily before breakfast.         . Norgestimate-Ethinyl Estradiol Triphasic (ORTHO TRI-CYCLEN LO) 0.18/0.215/0.25 MG-25 MCG tab   Oral   Take 1 tablet by mouth daily.         . valACYclovir (VALTREX) 1000 MG tablet   Oral   Take 1,000 mg by mouth 2 (two) times daily.         Marland Kitchen amoxicillin-clavulanate (AUGMENTIN) 875-125 MG per tablet   Oral   Take 1 tablet by mouth  every 12 (twelve) hours.   42 tablet   0   . omeprazole (PRILOSEC OTC) 20 MG tablet   Oral   Take 20 mg by mouth daily.           . ondansetron (ZOFRAN ODT) 4 MG disintegrating tablet   Oral   Take 1 tablet (4 mg total) by mouth every 8 (eight) hours as needed for nausea or vomiting.   20 tablet   0   . ondansetron (ZOFRAN ODT) 8 MG disintegrating tablet   Oral   Take 1 tablet (8 mg total) by mouth every 8 (eight) hours as needed for nausea.   20 tablet   0   . pantoprazole (PROTONIX) 20 MG tablet   Oral   Take 1 tablet (20 mg total) by mouth daily.   30 tablet   0   . Ranitidine HCl (ZANTAC PO)   Oral   Take by mouth as needed.          BP 118/80  Pulse 87  Temp(Src) 98.3 F (36.8 C) (Oral)  Resp 16  SpO2 100%  LMP 11/30/2013 Physical Exam  Nursing note and vitals reviewed. Constitutional: She is oriented to person, place,  and time. She appears well-developed and well-nourished.  HENT:  Head: Normocephalic and atraumatic.  Eyes: EOM are normal. Pupils are equal, round, and reactive to light.  Neck: Normal range of motion. Neck supple.  Cardiovascular: Normal rate, normal heart sounds and intact distal pulses.   Pulmonary/Chest: Effort normal and breath sounds normal.  Abdominal: Bowel sounds are normal. She exhibits no distension. There is no tenderness.  Genitourinary:  Thick White discharge, no bleeding, os is closed, no CMT, mild R adnexal tenderness, no masses  Musculoskeletal: Normal range of motion. She exhibits no edema and no tenderness.  Neurological: She is alert and oriented to person, place, and time. She has normal strength. No cranial nerve deficit or sensory deficit.  Skin: Skin is warm and dry. No rash noted.  Psychiatric: She has a normal mood and affect.    ED Course  Procedures (including critical care time) Labs Review Labs Reviewed  WET PREP, GENITAL - Abnormal; Notable for the following:    Clue Cells Wet Prep HPF POC MODERATE  (*)    All other components within normal limits  PREGNANCY, URINE - Abnormal; Notable for the following:    Preg Test, Ur POSITIVE (*)    All other components within normal limits  URINALYSIS, ROUTINE W REFLEX MICROSCOPIC - Abnormal; Notable for the following:    Color, Urine AMBER (*)    APPearance CLOUDY (*)    Hgb urine dipstick TRACE (*)    Ketones, ur 15 (*)    Protein, ur 30 (*)    Leukocytes, UA TRACE (*)    All other components within normal limits  CBC WITH DIFFERENTIAL - Abnormal; Notable for the following:    Hemoglobin 10.8 (*)    HCT 33.5 (*)    MCV 73.8 (*)    MCH 23.8 (*)    All other components within normal limits  HCG, QUANTITATIVE, PREGNANCY - Abnormal; Notable for the following:    hCG, Beta Chain, Quant, S 32301 (*)    All other components within normal limits  URINE MICROSCOPIC-ADD ON - Abnormal; Notable for the following:    Bacteria, UA MANY (*)    All other components within normal limits  GC/CHLAMYDIA PROBE AMP  BASIC METABOLIC PANEL   Imaging Review [place] Ob Comp Less 14 Wks  01/12/2014   CLINICAL DATA:  Abdominal pain and nausea.  Positive pregnancy test.  EXAM: OBSTETRIC <14 WK US AND TRANSVAGINAL OB US  TECHNIQUE: Both transabdominal and transvaginal ultrasound examinations were performed for complete evaluation of the gestation as well as the maternal uterus, adnexal regions, and pelvic cul-de-sac. Transvaginal technique was performed to assess early pregnancy.  COMPARISON:  None.  FINDINGS: Intrauterine gestational sac: Visualized/normal in shape.  Yolk sac:  Visualized  Embryo:  Visualized  Cardiac Activity: Visualized  Heart Rate:  123 bpm  CRL:   7  mm   6 w 4 d                  US EDC: 09/03/14  Maternal uterus/adnexae: No adnexal mass identified. Tiny amount of free fluid noted in cul-de-sac. A subserosal fibroid is seen in the left uterus measuring 5.2 cm. A smaller intramural fibroid is seen in the anterior uterine wall measuring 2.7 cm.  IMPRESSION:  Single living IUP measuring 6 weeks 4 days with US EDC of 09/03/14.  At least 2 uterine fibroids, largest measuring 5.2 cm.   Electronically Signed   By: Myles RosenthalJohn  Stahl M.D.   On: 01/12/2014  14:01   US Ob Transvaginal  01/12/2014   CLINICAL DATA:  Abdominal pain and nausea.  Positive pregnancy test.  EXAM: OBSTETRIC <14 WK [place] AND TRANSVAGINAL OB US  TECHNIQUE: Both transabdominal and transvaginal ultrasound examinations were performed for complete evaluation of the gestation as well as the maternal uterus, adnexal regions, and pelvic cul-de-sac. Transvaginal technique was performed to assess early pregnancy.  COMPARISON:  None.  FINDINGS: Intrauterine gestational sac: Visualized/normal in shape.  Yolk sac:  Visualized  Embryo:  Visualized  Cardiac Activity: Visualized  Heart Rate:  123 bpm  CRL:   7  mm   6 w 4 d                  [place] EDC: 09/03/14  Maternal uterus/adnexae: No adnexal mass identified. Tiny amount of free fluid noted in cul-de-sac. A subserosal fibroid is seen in the left uterus measuring 5.2 cm. A smaller intramural fibroid is seen in the anterior uterine wall measuring 2.7 cm.  IMPRESSION: Single living IUP measuring 6 weeks 4 days with [place] EDC of 09/03/14.  At least 2 uterine fibroids, largest measuring 5.2 cm.   Electronically Signed   By: Earle Gell M.D.   On: 01/12/2014 14:01     MDM   Final diagnoses:  Pregnancy  Nausea/vomiting in pregnancy  Bacterial vaginosis    Preg is positive. Will check quant and send for TV US. Pt informed of pregnancy. She is typically seen at [place].   2:15 PM Labs and imaging reviewed. Will treat for BV, Prenatal Vitamins, antiemetics and Ob followup,.   Lashannon Bresnan B. Karle Starch, MD 01/12/14 1416

## 2014-01-12 NOTE — ED Notes (Signed)
Pelvic cart is at the bedside set up and ready for the doctor to use. 

## 2014-01-12 NOTE — ED Notes (Signed)
Patient states she has a one week history of decreased appetite associated with a lot of vomiting and diarrhea.  States she was seen here before and diagnosed with acid reflux, referred to GI but was unable to follow up due to job lost.  States she just now realized she has not had a menstrual cycle since January.

## 2014-01-13 LAB — GC/CHLAMYDIA PROBE AMP
CT Probe RNA: NEGATIVE
GC PROBE AMP APTIMA: NEGATIVE

## 2014-02-05 ENCOUNTER — Inpatient Hospital Stay (HOSPITAL_COMMUNITY)
Admission: AD | Admit: 2014-02-05 | Discharge: 2014-02-05 | Disposition: A | Discharge status: Home / Self Care | Payer: Medicaid Other | Source: Ambulatory Visit | Attending: Obstetrics & Gynecology | Admitting: Obstetrics & Gynecology

## 2014-02-05 ENCOUNTER — Encounter (HOSPITAL_COMMUNITY): Payer: Self-pay

## 2014-02-05 DIAGNOSIS — D649 Anemia, unspecified: Secondary | ICD-10-CM | POA: Insufficient documentation

## 2014-02-05 DIAGNOSIS — K219 Gastro-esophageal reflux disease without esophagitis: Secondary | ICD-10-CM | POA: Insufficient documentation

## 2014-02-05 DIAGNOSIS — O99019 Anemia complicating pregnancy, unspecified trimester: Secondary | ICD-10-CM | POA: Insufficient documentation

## 2014-02-05 DIAGNOSIS — O210 Mild hyperemesis gravidarum: Secondary | ICD-10-CM | POA: Insufficient documentation

## 2014-02-05 DIAGNOSIS — O99891 Other specified diseases and conditions complicating pregnancy: Secondary | ICD-10-CM | POA: Insufficient documentation

## 2014-02-05 DIAGNOSIS — O219 Vomiting of pregnancy, unspecified: Secondary | ICD-10-CM

## 2014-02-05 DIAGNOSIS — O9989 Other specified diseases and conditions complicating pregnancy, childbirth and the puerperium: Principal | ICD-10-CM

## 2014-02-05 DIAGNOSIS — R079 Chest pain, unspecified: Secondary | ICD-10-CM | POA: Insufficient documentation

## 2014-02-05 LAB — CBC
HEMATOCRIT: 30.5 % — AB (ref 36.0–46.0)
Hemoglobin: 10 g/dL — ABNORMAL LOW (ref 12.0–15.0)
MCH: 24.2 pg — ABNORMAL LOW (ref 26.0–34.0)
MCHC: 32.8 g/dL (ref 30.0–36.0)
MCV: 73.8 fL — ABNORMAL LOW (ref 78.0–100.0)
Platelets: 372 10*3/uL (ref 150–400)
RBC: 4.13 MIL/uL (ref 3.87–5.11)
RDW: 15.7 % — ABNORMAL HIGH (ref 11.5–15.5)
WBC: 7.8 10*3/uL (ref 4.0–10.5)

## 2014-02-05 LAB — URINALYSIS, ROUTINE W REFLEX MICROSCOPIC
Bilirubin Urine: NEGATIVE
GLUCOSE, UA: NEGATIVE mg/dL
Hgb urine dipstick: NEGATIVE
KETONES UR: NEGATIVE mg/dL
LEUKOCYTES UA: NEGATIVE
Nitrite: NEGATIVE
PROTEIN: NEGATIVE mg/dL
Specific Gravity, Urine: 1.02 (ref 1.005–1.030)
Urobilinogen, UA: 0.2 mg/dL (ref 0.0–1.0)
pH: 6 (ref 5.0–8.0)

## 2014-02-05 LAB — WET PREP, GENITAL
Clue Cells Wet Prep HPF POC: NONE SEEN
Trich, Wet Prep: NONE SEEN
Yeast Wet Prep HPF POC: NONE SEEN

## 2014-02-05 MED ORDER — RANITIDINE HCL 150 MG PO TABS: 150.0000 mg | ORAL_TABLET | Freq: Two times a day (BID) | ORAL | Status: AC

## 2014-02-05 MED ORDER — GI COCKTAIL ~~LOC~~
30.0000 mL | Freq: Once | ORAL | Status: AC
  Administered 2014-02-05: 30 mL via ORAL
  Filled 2014-02-05: qty 30

## 2014-02-05 NOTE — MAU Note (Signed)
Patient states she has been having upper chest pain this am, less now than when it started. States she has had nausea and vomiting for 2-3 days and a white vaginal discharge. Denies abdominal pain.

## 2014-02-05 NOTE — Discharge Instructions (Signed)

## 2014-02-05 NOTE — MAU Provider Note (Signed)
History     CSN: [id number]  Arrival date and time: 02/05/14 8299   First Provider Initiated Contact with Patient 02/05/14 1139      Chief Complaint  Patient presents with  . Chest Pain  . Dizziness  . Emesis During Pregnancy  . Vaginal Discharge   HPI  Anita Boyd is a 38 y.o. female G1P0 at [redacted]w[redacted]d who presents to MAU with multiple complaints; dizziness, N/V, and vaginal discharge. The patient was seen at the health department this morning for her first prenatal appt and was told that she looked "dizzy" and it was recommended that she come here for evaluation. She was experiencing chest pain every time she took a deep breath; she is not currently having chest pain. She has a history of GERD, however stopped her medication because she was unsure of what medications she could take .  OB History   Grav Para Term Preterm Abortions TAB SAB Ect Mult Living   1               Past Medical History  Diagnosis Date  . Genital herpes   . Acid reflux     Past Surgical History  Procedure Laterality Date  . Tendon repair      left hand  . Hand tendon surgery      History reviewed. No pertinent family history.  History  Substance Use Topics  . Smoking status: Never Smoker   . Smokeless tobacco: Not on file  . Alcohol Use: Yes     Comment: 2x/wk    Allergies: No Known Allergies  Prescriptions prior to admission  Medication Sig Dispense Refill  . Prenatal Vit-Fe Fumarate-FA (MULTIVITAMIN-PRENATAL) 27-0.8 MG TABS tablet Take 1 tablet by mouth daily at 12 noon.  30 each  0  . valACYclovir (VALTREX) 1000 MG tablet Take 1,000 mg by mouth 2 (two) times daily.       Results for orders placed during the hospital encounter of 02/05/14 (from the past 48 hour(s))  URINALYSIS, ROUTINE W REFLEX MICROSCOPIC     Status: None   Collection Time    02/05/14 10:25 AM      Result Value Ref Range   Color, Urine YELLOW  YELLOW   APPearance CLEAR  CLEAR   Specific Gravity, Urine 1.020   1.005 - 1.030   pH 6.0  5.0 - 8.0   Glucose, UA NEGATIVE  NEGATIVE mg/dL   Hgb urine dipstick NEGATIVE  NEGATIVE   Bilirubin Urine NEGATIVE  NEGATIVE   Ketones, ur NEGATIVE  NEGATIVE mg/dL   Protein, ur NEGATIVE  NEGATIVE mg/dL   Urobilinogen, UA 0.2  0.0 - 1.0 mg/dL   Nitrite NEGATIVE  NEGATIVE   Leukocytes, UA NEGATIVE  NEGATIVE   Comment: MICROSCOPIC NOT DONE ON URINES WITH NEGATIVE PROTEIN, BLOOD, LEUKOCYTES, NITRITE, OR GLUCOSE <1000 mg/dL.  CBC     Status: Abnormal   Collection Time    02/05/14 12:04 PM      Result Value Ref Range   WBC 7.8  4.0 - 10.5 K/uL   RBC 4.13  3.87 - 5.11 MIL/uL   Hemoglobin 10.0 (*) 12.0 - 15.0 g/dL   HCT 30.5 (*) 36.0 - 46.0 %   MCV 73.8 (*) 78.0 - 100.0 fL   MCH 24.2 (*) 26.0 - 34.0 pg   MCHC 32.8  30.0 - 36.0 g/dL   RDW 15.7 (*) 11.5 - 15.5 %   Platelets 372  150 - 400 K/uL  WET PREP, GENITAL  Status: Abnormal   Collection Time    02/05/14 12:32 PM      Result Value Ref Range   Yeast Wet Prep HPF POC NONE SEEN  NONE SEEN   Trich, Wet Prep NONE SEEN  NONE SEEN   Clue Cells Wet Prep HPF POC NONE SEEN  NONE SEEN   WBC, Wet Prep HPF POC MODERATE (*) NONE SEEN   Comment: BACTERIA- TOO NUMEROUS TO COUNT    Review of Systems  Constitutional: Negative for fever and chills.  Gastrointestinal: Positive for heartburn, nausea and vomiting. Negative for abdominal pain, diarrhea and constipation.  Genitourinary: Negative for dysuria, urgency, frequency and hematuria.       + vaginal discharge. No vaginal bleeding. No dysuria.    Physical Exam   Blood pressure 116/76, pulse 78, temperature 98.5 F (36.9 C), temperature source Oral, resp. rate 16, height 5' 8.5" (1.74 m), weight 92.443 kg (203 lb 12.8 oz), last menstrual period 11/30/2013, SpO2 100.00%.  Physical Exam  Constitutional: She appears well-developed and well-nourished.  Non-toxic appearance. She does not have a sickly appearance. She does not appear ill. No distress.  HENT:  Head:  Normocephalic.  Eyes: Pupils are equal, round, and reactive to light.  Neck: Neck supple.  Respiratory: Effort normal.  GI: Soft. She exhibits no distension. There is no tenderness. There is no rebound and no guarding.  Musculoskeletal: Normal range of motion.  Neurological: She is alert.  Skin: Skin is warm. She is not diaphoretic.  Psychiatric: Her behavior is normal.    MAU Course  Procedures None  MDM Orthostatic vital signs normal  UA: no sign of dehydration CBC  Wet prep; done by RN  Gi cocktail per patient requests; pt states that the gi cocktail took away her heartburn completely.  Pt rates her pain 0/10 at discharge   Assessment and Plan   A: GERD in pregnancy Nausea and vomiting in pregnancy Anemia in pregnancy    P:  Discharge home in stable condition  RX: Zantac  B6 and Unisom over the counter. 12.5 mg at bedtime, 25 mg PO BID  Continue to take prenatal vitamin with iron daily  Eat small and frequent meals  Keep your next scheduled appointment with the health department.   Darrelyn Hillock Jamilia Jacques, NP  02/05/2014, 4:58 PM

## 2014-02-07 NOTE — MAU Provider Note (Signed)
Attestation of Attending Supervision of Advanced Practitioner (CNM/NP): Evaluation and management procedures were performed by the Advanced Practitioner under my supervision and collaboration. I have reviewed the Advanced Practitioner's note and chart, and I agree with the management and plan.  Imonie Tuch H. 4:02 PM

## 2014-02-08 ENCOUNTER — Other Ambulatory Visit (HOSPITAL_COMMUNITY): Payer: Self-pay | Admitting: Nurse Practitioner

## 2014-02-08 DIAGNOSIS — O09529 Supervision of elderly multigravida, unspecified trimester: Secondary | ICD-10-CM

## 2014-02-08 DIAGNOSIS — Z3682 Encounter for antenatal screening for nuchal translucency: Secondary | ICD-10-CM

## 2014-02-08 LAB — OB RESULTS CONSOLE HIV ANTIBODY (ROUTINE TESTING): HIV: NONREACTIVE

## 2014-02-08 LAB — OB RESULTS CONSOLE GC/CHLAMYDIA
Chlamydia: NEGATIVE
Gonorrhea: NEGATIVE

## 2014-02-08 LAB — OB RESULTS CONSOLE ABO/RH: RH Type: POSITIVE

## 2014-02-08 LAB — OB RESULTS CONSOLE HEPATITIS B SURFACE ANTIGEN: HEP B S AG: NEGATIVE

## 2014-02-08 LAB — OB RESULTS CONSOLE RPR: RPR: NONREACTIVE

## 2014-02-08 LAB — OB RESULTS CONSOLE RUBELLA ANTIBODY, IGM: Rubella: IMMUNE

## 2014-02-08 LAB — OB RESULTS CONSOLE ANTIBODY SCREEN: Antibody Screen: NEGATIVE

## 2014-02-26 ENCOUNTER — Other Ambulatory Visit (HOSPITAL_COMMUNITY): Payer: Medicaid Other

## 2014-02-27 ENCOUNTER — Encounter (HOSPITAL_COMMUNITY): Payer: Self-pay

## 2014-02-27 ENCOUNTER — Other Ambulatory Visit (HOSPITAL_COMMUNITY): Payer: Self-pay | Admitting: Nurse Practitioner

## 2014-02-27 ENCOUNTER — Ambulatory Visit (HOSPITAL_COMMUNITY)
Admission: RE | Admit: 2014-02-27 | Discharge: 2014-02-27 | Disposition: A | Discharge status: Home / Self Care | Payer: Medicaid Other | Source: Ambulatory Visit | Attending: Nurse Practitioner | Admitting: Nurse Practitioner

## 2014-02-27 ENCOUNTER — Ambulatory Visit (HOSPITAL_COMMUNITY): Admission: RE | Admit: 2014-02-27 | Payer: Medicaid Other | Source: Ambulatory Visit

## 2014-02-27 DIAGNOSIS — Z3682 Encounter for antenatal screening for nuchal translucency: Secondary | ICD-10-CM

## 2014-02-27 DIAGNOSIS — O3510X0 Maternal care for (suspected) chromosomal abnormality in fetus, unspecified, not applicable or unspecified: Secondary | ICD-10-CM | POA: Insufficient documentation

## 2014-02-27 DIAGNOSIS — O09529 Supervision of elderly multigravida, unspecified trimester: Secondary | ICD-10-CM

## 2014-02-27 DIAGNOSIS — O351XX0 Maternal care for (suspected) chromosomal abnormality in fetus, not applicable or unspecified: Secondary | ICD-10-CM | POA: Insufficient documentation

## 2014-02-27 DIAGNOSIS — O09519 Supervision of elderly primigravida, unspecified trimester: Secondary | ICD-10-CM | POA: Insufficient documentation

## 2014-02-27 DIAGNOSIS — Z3689 Encounter for other specified antenatal screening: Secondary | ICD-10-CM | POA: Insufficient documentation

## 2014-02-27 NOTE — Progress Notes (Addendum)
Genetic Counseling  High-Risk Gestation Note  Appointment Date:  02/27/2014 Referred By: Virginia Rochester, NP Date of Birth:  03-04-1976   Pregnancy History: G1P0000 Estimated Date of Delivery: 09/06/14 Estimated Gestational Age: [redacted]w[redacted]d Attending: Renella Cunas, MD   [name] was seen for genetic counseling because of a maternal age of 38 y.o..     She was counseled regarding maternal age and the association with risk for chromosome conditions due to nondisjunction with aging of the ova.   We reviewed chromosomes, nondisjunction, and the associated 1 in 74 risk for fetal aneuploidy related to a maternal age of 38 y.o. at [redacted]w[redacted]d gestation.  She was counseled that the risk for aneuploidy decreases as gestational age increases, accounting for those pregnancies which spontaneously abort.  We specifically discussed Down syndrome (trisomy 75), trisomies 26 and 42, and sex chromosome aneuploidies (47,XXX and 47,XXY) including the common features and prognoses of each.   We reviewed available screening options including First Screen, Quad screen, noninvasive prenatal screening (NIPS)/cell free DNA (cfDNA) testing, and detailed ultrasound.  She was counseled that screening tests are used to modify a patient's a priori risk for aneuploidy, typically based on age. This estimate provides a pregnancy specific risk assessment. We reviewed the benefits and limitations of each option. Specifically, we discussed the conditions for which each test screens, the detection rates, and false positive rates of each. She was also counseled regarding diagnostic testing via CVS and amniocentesis. We reviewed the approximate 1 in 510 risk for complications for CVS and the approximate 1 in 258-527 risk for complications for amniocentesis, including spontaneous pregnancy loss. After consideration of all the options, she elected to proceed with first trimester screening at the time of today's visit.  She stated that she  will further consider NIPS pending the results of first trimester screening. [name] declined invasive testing in the pregnancy (CVS and amniocentesis).     Nuchal translucency ultrasound was performed today.  The report will be documented separately.  Detailed ultrasound is available to the patient at ~18+ weeks gestation.  She understands that screening tests cannot rule out all birth defects or genetic syndromes. The patient was advised of this limitation and states she still does not want additional testing at this time.    [name] was provided with written information regarding sickle cell anemia (SCA) including the carrier frequency and incidence in the African-American population, the availability of carrier testing and prenatal diagnosis if indicated.  In addition, we discussed that hemoglobinopathies are routinely screened for as part of the [place] newborn screening panel.  She declined hemoglobin electrophoresis today given that blood work was performed at her OB visit on 02/08/14, and this testing may have been performed at that time. If not performed, hemoglobin electrophoresis is available and can be performed at any time.   Both family histories were reviewed and found to be contributory for congenital deafness for the patient's paternal half-sister. She also reported that her father has deafness in his left ear, but that it may have happened after being in the [employer]. An underlying cause was not known by the patient her half-sister's deafness. Hearing loss can have many causes including genetic factors, environmental factors or a combination of both.  Sometimes hearing loss can occur as one feature of an underlying genetic condition or may be caused by a single nonworking gene. Additional information regarding a cause for their hearing loss is needed in order to most accurately assess the chance for  their children. We reviewed that hearing is assessed as part of newborn screening for babies born  in the hospital in [place]. Without further information regarding the provided family history, an accurate genetic risk cannot be calculated. Further genetic counseling is warranted if more information is obtained.  [name] denied exposure to environmental toxins or chemical agents. She denied the use of alcohol, tobacco or street drugs. She denied significant viral illnesses during the course of her pregnancy. Her medical and surgical histories were noncontributory.   I counseled [name] regarding the above risks and available options.  The approximate face-to-face time with the genetic counselor was 45 minutes.  Anita Nicolas Ech Shantanu Strauch, MS,  Certified Genetic Counselor 02/27/2014  Addendum 03/08/2014: Nuchal translucency and nasal bone were unable to be obtained at the time of the patient's 02/27/14 ultrasound. Follow-up was planned for 03/07/14 for second NT attempt. At the time of the patient's ultrasound on 03/07/14, crown rump length was too large to obtain nuchal translucency measurement. After careful consideration, [name] elected to proceed with NIPS (Panorama) at that time. Results will be available in approximately 7-10 business days. We will contact the patient with these results.

## 2014-03-07 ENCOUNTER — Other Ambulatory Visit (HOSPITAL_COMMUNITY): Payer: Self-pay | Admitting: Nurse Practitioner

## 2014-03-07 ENCOUNTER — Ambulatory Visit (HOSPITAL_COMMUNITY)
Admission: RE | Admit: 2014-03-07 | Discharge: 2014-03-07 | Disposition: A | Discharge status: Home / Self Care | Payer: Medicaid Other | Source: Ambulatory Visit | Attending: Nurse Practitioner | Admitting: Nurse Practitioner

## 2014-03-07 ENCOUNTER — Other Ambulatory Visit: Payer: Self-pay

## 2014-03-07 DIAGNOSIS — Z3689 Encounter for other specified antenatal screening: Secondary | ICD-10-CM

## 2014-03-07 DIAGNOSIS — O09529 Supervision of elderly multigravida, unspecified trimester: Secondary | ICD-10-CM

## 2014-03-07 DIAGNOSIS — Z3682 Encounter for antenatal screening for nuchal translucency: Secondary | ICD-10-CM

## 2014-03-07 DIAGNOSIS — Z36 Encounter for antenatal screening of mother: Secondary | ICD-10-CM | POA: Insufficient documentation

## 2014-03-07 NOTE — Progress Notes (Signed)
[facility] ultrasound  Indication: 38 yr old G1P0 at [redacted]w[redacted]d for first trimester screen.  Findings: 1. Single intrauterine pregnancy. 2. Fetal crown rump length is consistent with dating. 3. Normal uterus; again seen are several fibroids the largest measuring 5cm.  Recommendations: 1. Nuchal translucency could not be obtained as crown rump length exceeds limit for first trimester screen. 2. Advanced maternal age: - previously counseled - given cannot have first trimester screen discussed other options for screening of cell free fetal DNA and quad screen and limitations in detecting fetal aneuploidy - patient opted for cell free fetal DNA which was drawn today 3. Recommend maternal serum AFP at 15-20 weeks 4. Recommend fetal anatomic survey at 18-20 weeks 5. Uterine fibroids: - recommend serial fetal growth ultrasounds starting at 24 weeks  [place], MD

## 2014-03-16 ENCOUNTER — Telehealth (HOSPITAL_COMMUNITY): Payer: Self-pay | Admitting: MS"

## 2014-03-16 NOTE — Telephone Encounter (Signed)
Called Anita Boyd to discuss her cell free fetal DNA test results.  Anita Boyd had Panorama testing through [facility].  Testing was offered because of advanced maternal age.   The patient was identified by name and DOB.  We reviewed that these are within normal limits, showing a less than 1 in 10,000 risk for trisomies 21, 18 and 13, and monosomy X (Turner syndrome).  In addition, the risk for triploidy/vanishing twin and sex chromosome trisomies (47,XXX and 47,XXY) was also low risk.  We reviewed that this testing identifies > 99% of pregnancies with trisomy 41, trisomy 2, sex chromosome trisomies (47,XXX and 47,XXY), and triploidy. The detection rate for trisomy 18 is 96%.  The detection rate for monosomy X is ~92%.  The false positive rate is <0.1% for all conditions. Testing was also consistent with female gender.  The patient did wish to know gender.  She understands that this testing does not identify all genetic conditions.  All questions were answered to her satisfaction, she was encouraged to call with additional questions or concerns.  Kandra Nicolas Gildardo Griffes, MS Certified Genetic Counselor 03/16/2014 1:58 PM

## 2014-04-11 ENCOUNTER — Encounter (HOSPITAL_COMMUNITY): Payer: Self-pay

## 2014-04-11 ENCOUNTER — Ambulatory Visit (HOSPITAL_COMMUNITY)
Admission: RE | Admit: 2014-04-11 | Discharge: 2014-04-11 | Disposition: A | Discharge status: Home / Self Care | Payer: Medicaid Other | Source: Ambulatory Visit | Attending: Nurse Practitioner | Admitting: Nurse Practitioner

## 2014-04-11 DIAGNOSIS — O09529 Supervision of elderly multigravida, unspecified trimester: Secondary | ICD-10-CM | POA: Insufficient documentation

## 2014-04-11 DIAGNOSIS — Z3689 Encounter for other specified antenatal screening: Secondary | ICD-10-CM | POA: Insufficient documentation

## 2014-04-23 ENCOUNTER — Encounter (HOSPITAL_COMMUNITY): Payer: Self-pay | Admitting: *Deleted

## 2014-04-23 ENCOUNTER — Inpatient Hospital Stay (HOSPITAL_COMMUNITY)
Admission: AD | Admit: 2014-04-23 | Discharge: 2014-04-23 | Disposition: A | Discharge status: Home / Self Care | Payer: Medicaid Other | Source: Ambulatory Visit | Attending: Obstetrics & Gynecology | Admitting: Obstetrics & Gynecology

## 2014-04-23 DIAGNOSIS — B9689 Other specified bacterial agents as the cause of diseases classified elsewhere: Secondary | ICD-10-CM | POA: Insufficient documentation

## 2014-04-23 DIAGNOSIS — O2390 Unspecified genitourinary tract infection in pregnancy, unspecified trimester: Secondary | ICD-10-CM | POA: Insufficient documentation

## 2014-04-23 DIAGNOSIS — D259 Leiomyoma of uterus, unspecified: Secondary | ICD-10-CM | POA: Insufficient documentation

## 2014-04-23 DIAGNOSIS — O3410 Maternal care for benign tumor of corpus uteri, unspecified trimester: Secondary | ICD-10-CM | POA: Insufficient documentation

## 2014-04-23 DIAGNOSIS — A499 Bacterial infection, unspecified: Secondary | ICD-10-CM | POA: Insufficient documentation

## 2014-04-23 DIAGNOSIS — N949 Unspecified condition associated with female genital organs and menstrual cycle: Secondary | ICD-10-CM

## 2014-04-23 DIAGNOSIS — N760 Acute vaginitis: Secondary | ICD-10-CM | POA: Insufficient documentation

## 2014-04-23 DIAGNOSIS — D219 Benign neoplasm of connective and other soft tissue, unspecified: Secondary | ICD-10-CM

## 2014-04-23 DIAGNOSIS — R1032 Left lower quadrant pain: Secondary | ICD-10-CM | POA: Insufficient documentation

## 2014-04-23 HISTORY — DX: Leiomyoma of uterus, unspecified: D25.9

## 2014-04-23 HISTORY — DX: Headache: R51

## 2014-04-23 LAB — WET PREP, GENITAL
TRICH WET PREP: NONE SEEN
Yeast Wet Prep HPF POC: NONE SEEN

## 2014-04-23 MED ORDER — ONDANSETRON 8 MG PO TBDP
8.0000 mg | ORAL_TABLET | Freq: Once | ORAL | Status: AC
  Administered 2014-04-23: 8 mg via ORAL
  Filled 2014-04-23: qty 1

## 2014-04-23 MED ORDER — METRONIDAZOLE 500 MG PO TABS: 500.0000 mg | ORAL_TABLET | Freq: Two times a day (BID) | ORAL | Status: DC

## 2014-04-23 MED ORDER — IBUPROFEN 600 MG PO TABS
600.0000 mg | ORAL_TABLET | Freq: Once | ORAL | Status: AC
  Administered 2014-04-23: 600 mg via ORAL
  Filled 2014-04-23: qty 1

## 2014-04-23 NOTE — MAU Note (Signed)
LLQ pain since Saturday, hx of fibroids.  Denies bleeding or LOF.  Vomiting started this a.m., no diarrhea.

## 2014-04-23 NOTE — MAU Provider Note (Signed)
Attestation of Attending Supervision of Advanced Practitioner (CNM/NP): Evaluation and management procedures were performed by the Advanced Practitioner under my supervision and collaboration.  I have reviewed the Advanced Practitioner's note and chart, and I agree with the management and plan.  Crystalle Popwell 04/23/2014 2:04 PM

## 2014-04-23 NOTE — Discharge Instructions (Signed)
Bacterial Vaginosis Bacterial vaginosis is an infection of the vagina. It happens when too many of certain germs (bacteria) grow in the vagina. HOME CARE  Take your medicine as told by your doctor.  Finish your medicine even if you start to feel better.  Do not have sex until you finish your medicine and are better.  Tell your sex partner that you have an infection. They should see their doctor for treatment.  Practice safe sex. Use condoms. Have only one sex partner. GET HELP IF:  You are not getting better after 3 days of treatment.  You have more grey fluid (discharge) coming from your vagina than before.  You have more pain than before.  You have a fever. MAKE SURE YOU:   Understand these instructions.  Will watch your condition.  Will get help right away if you are not doing well or get worse. Document Released: 08/11/2008 Document Revised: 08/23/2013 Document Reviewed: 06/14/2013 [facility] Patient Information 2014 [place]. Abdominal Pain During Pregnancy Belly (abdominal) pain is common during pregnancy. Most of the time, it is not a serious problem. Other times, it can be a sign that something is wrong with the pregnancy. Always tell your doctor if you have belly pain. HOME CARE Monitor your belly pain for any changes. The following actions may help you feel better:  Do not have sex (intercourse) or put anything in your vagina until you feel better.  Rest until your pain stops.  Drink clear fluids if you feel sick to your stomach (nauseous). Do not eat solid food until you feel better.  Only take medicine as told by your doctor.  Keep all doctor visits as told. GET HELP RIGHT AWAY IF:   You are bleeding, leaking fluid, or pieces of tissue come out of your vagina.  You have more pain or cramping.  You keep throwing up (vomiting).  You have pain when you pee (urinate) or have blood in your pee.  You have a fever.  You do not feel your baby moving  as much.  You feel very weak or feel like passing out.  You have trouble breathing, with or without belly pain.  You have a very bad headache and belly pain.  You have fluid leaking from your vagina and belly pain.  You keep having watery poop (diarrhea).  Your belly pain does not go away after resting, or the pain gets worse. MAKE SURE YOU:   Understand these instructions.  Will watch your condition.  Will get help right away if you are not doing well or get worse. Document Released: 10/21/2009 Document Revised: 07/05/2013 Document Reviewed: 06/01/2013 [facility] Patient Information 2014 [place], [place].

## 2014-04-23 NOTE — MAU Provider Note (Signed)
History     CSN: [id number]  Arrival date and time: 04/23/14 9381   First Provider Initiated Contact with Patient 04/23/14 0757      Chief Complaint  Patient presents with  . Abdominal Pain  . Emesis During Pregnancy   HPI Anita Boyd is a 38 y.o. G1P0000 at [redacted]w[redacted]d who presents to MAU today with complaint of LLQ pain since Saturday. The patient states that it feels like pressure in the lower abdomen only on the left side. The denies change in pain with rest or ambulation. She states pain has been constant since Saturday and rates her pain at 6/10 now. She denies vaginal bleeding, discharge, LOF, contractions, UTI symptoms or fever. She has not taken anything for pain. She reports good fetal movement.   OB History   Grav Para Term Preterm Abortions TAB SAB Ect Mult Living   1 0 0 0 0 0 0 0 0 0       Past Medical History  Diagnosis Date  . Genital herpes   . Acid reflux   . Fibroid, uterine   . OFBPZWCH(852.7)     Past Surgical History  Procedure Laterality Date  . Tendon repair      left hand  . Hand tendon surgery    . Wisdom tooth extraction      History reviewed. No pertinent family history.  History  Substance Use Topics  . Smoking status: Never Smoker   . Smokeless tobacco: Not on file  . Alcohol Use: Yes     Comment: 2x/wk    Allergies: No Known Allergies  Prescriptions prior to admission  Medication Sig Dispense Refill  . Prenatal Vit-Fe Fumarate-FA (MULTIVITAMIN-PRENATAL) 27-0.8 MG TABS tablet Take 1 tablet by mouth daily at 12 noon.  30 each  0  . Pyridoxine HCl (B-6 PO) Take by mouth.      . ranitidine (ZANTAC) 150 MG tablet Take 1 tablet (150 mg total) by mouth 2 (two) times daily.  60 tablet  1  . valACYclovir (VALTREX) 1000 MG tablet Take 1,000 mg by mouth 2 (two) times daily.        Review of Systems  Constitutional: Negative for fever and malaise/fatigue.  Gastrointestinal: Positive for nausea and vomiting. Negative for abdominal pain,  diarrhea and constipation.  Genitourinary: Negative for dysuria, urgency and frequency.       Neg - vaginal bleeding, abnormal discharge or LOF   Physical Exam   Blood pressure 121/71, pulse 86, temperature 98 F (36.7 C), temperature source Oral, resp. rate 20, height 5\' 10"  (1.778 m), weight 209 lb 9.6 oz (95.074 kg), last menstrual period 11/30/2013.  Physical Exam  Constitutional: She is oriented to person, place, and time. She appears well-developed and well-nourished. No distress.  HENT:  Head: Normocephalic and atraumatic.  Cardiovascular: Normal rate, regular rhythm and normal heart sounds.   Respiratory: Effort normal and breath sounds normal. No respiratory distress.  GI: Soft. Bowel sounds are normal. She exhibits no distension and no mass. There is tenderness (moderate tenderness to palpation of the LLQ). There is no rebound and no guarding.  Genitourinary: Uterus is enlarged (appropriate for GA). Uterus is not tender. Cervix exhibits no motion tenderness, no discharge and no friability. No bleeding around the vagina. Vaginal discharge (moderate amount of thin, white discharge noted) found.  Neurological: She is alert and oriented to person, place, and time.  Skin: Skin is warm and dry. No erythema.  Psychiatric: She has a normal mood and affect.  Dilation: Closed Effacement (%): Thick Exam by:: Bethena Midget PA  Results for orders placed during the hospital encounter of 04/23/14 (from the past 24 hour(s))  WET PREP, GENITAL     Status: Abnormal   Collection Time    04/23/14  8:10 AM      Result Value Ref Range   Yeast Wet Prep HPF POC NONE SEEN  NONE SEEN   Trich, Wet Prep NONE SEEN  NONE SEEN   Clue Cells Wet Prep HPF POC FEW (*) NONE SEEN   WBC, Wet Prep HPF POC FEW (*) NONE SEEN    MAU Course  Procedures None  MDM FHR - 143 bpm with doppler Patient unable to give sample for UA 600 mg ibuprofen given - patient reports significant improvement in pain Assessment  and Plan  A: Round ligament pain Uterine fibroids in pregnancy Bacterial vaginosis  P: Discharge home Rx for Flagyl sent to patient's pharmacy Advised Tyelnol PRN pain Advised maternity belt, warm bath/shower and moderation of activity PTL precautions discussed Patient advised to keep appointment with [facility] for routine prenatal care Patient may return to MAU as needed or if her condition were to change or worsen  Farris Has, PA-C  04/23/2014, 9:56 AM

## 2014-08-10 LAB — OB RESULTS CONSOLE GBS
GBS: NEGATIVE
STREP GROUP B AG: NEGATIVE

## 2014-09-02 ENCOUNTER — Inpatient Hospital Stay (HOSPITAL_COMMUNITY)
Admission: AD | Admit: 2014-09-02 | Discharge: 2014-09-04 | DRG: 775 | Disposition: A | Discharge status: Home / Self Care | Payer: Medicaid Other | Source: Ambulatory Visit | Attending: Obstetrics & Gynecology | Admitting: Obstetrics & Gynecology

## 2014-09-02 ENCOUNTER — Inpatient Hospital Stay (HOSPITAL_COMMUNITY): Payer: Medicaid Other | Admitting: Anesthesiology

## 2014-09-02 ENCOUNTER — Encounter (HOSPITAL_COMMUNITY): Payer: Self-pay | Admitting: *Deleted

## 2014-09-02 ENCOUNTER — Encounter (HOSPITAL_COMMUNITY): Payer: Medicaid Other | Admitting: Anesthesiology

## 2014-09-02 DIAGNOSIS — O99824 Streptococcus B carrier state complicating childbirth: Secondary | ICD-10-CM | POA: Diagnosis present

## 2014-09-02 DIAGNOSIS — O09513 Supervision of elderly primigravida, third trimester: Secondary | ICD-10-CM

## 2014-09-02 DIAGNOSIS — O471 False labor at or after 37 completed weeks of gestation: Secondary | ICD-10-CM | POA: Diagnosis present

## 2014-09-02 DIAGNOSIS — Z3A39 39 weeks gestation of pregnancy: Secondary | ICD-10-CM

## 2014-09-02 LAB — CBC
HCT: 31.6 % — ABNORMAL LOW (ref 36.0–46.0)
Hemoglobin: 10.3 g/dL — ABNORMAL LOW (ref 12.0–15.0)
MCH: 25.1 pg — ABNORMAL LOW (ref 26.0–34.0)
MCHC: 32.6 g/dL (ref 30.0–36.0)
MCV: 77.1 fL — AB (ref 78.0–100.0)
PLATELETS: 304 10*3/uL (ref 150–400)
RBC: 4.1 MIL/uL (ref 3.87–5.11)
RDW: 16.1 % — AB (ref 11.5–15.5)
WBC: 9.1 10*3/uL (ref 4.0–10.5)

## 2014-09-02 LAB — ABO/RH: ABO/RH(D): A POS

## 2014-09-02 LAB — TYPE AND SCREEN
ABO/RH(D): A POS
ANTIBODY SCREEN: NEGATIVE

## 2014-09-02 LAB — RPR

## 2014-09-02 LAB — HIV ANTIBODY (ROUTINE TESTING W REFLEX): HIV 1&2 Ab, 4th Generation: NONREACTIVE

## 2014-09-02 MED ORDER — LIDOCAINE HCL (PF) 1 % IJ SOLN
30.0000 mL | INTRAMUSCULAR | Status: DC | PRN
  Filled 2014-09-02: qty 30

## 2014-09-02 MED ORDER — FENTANYL 2.5 MCG/ML BUPIVACAINE 1/10 % EPIDURAL INFUSION (WH - ANES)
INTRAMUSCULAR | Status: DC | PRN
  Administered 2014-09-02: 14 mL/h via EPIDURAL
  Administered 2014-09-02: 21:00:00

## 2014-09-02 MED ORDER — LIDOCAINE HCL (PF) 1 % IJ SOLN
INTRAMUSCULAR | Status: DC | PRN
Start: 2014-09-02 — End: 2014-09-03
  Administered 2014-09-02: 5 mL
  Administered 2014-09-02: 3 mL
  Administered 2014-09-02: 5 mL

## 2014-09-02 MED ORDER — LACTATED RINGERS IV SOLN
INTRAVENOUS | Status: DC
  Administered 2014-09-02: 09:00:00 via INTRAVENOUS

## 2014-09-02 MED ORDER — BUPIVACAINE HCL (PF) 0.5 % IJ SOLN
INTRAMUSCULAR | Status: AC
  Filled 2014-09-02: qty 30

## 2014-09-02 MED ORDER — OXYCODONE-ACETAMINOPHEN 5-325 MG PO TABS: 1.0000 | ORAL_TABLET | ORAL | Status: DC | PRN

## 2014-09-02 MED ORDER — SODIUM CHLORIDE 0.9 % IV SOLN
2.0000 g | Freq: Once | INTRAVENOUS | Status: DC
  Filled 2014-09-02: qty 2000

## 2014-09-02 MED ORDER — LACTATED RINGERS IV SOLN: 500.0000 mL | Freq: Once | INTRAVENOUS | Status: DC

## 2014-09-02 MED ORDER — ONDANSETRON HCL 4 MG/2ML IJ SOLN: 4.0000 mg | Freq: Four times a day (QID) | INTRAMUSCULAR | Status: DC | PRN

## 2014-09-02 MED ORDER — FLEET ENEMA 7-19 GM/118ML RE ENEM: 1.0000 | ENEMA | RECTAL | Status: DC | PRN

## 2014-09-02 MED ORDER — LACTATED RINGERS IV SOLN: 500.0000 mL | INTRAVENOUS | Status: DC | PRN

## 2014-09-02 MED ORDER — OXYTOCIN 40 UNITS IN LACTATED RINGERS INFUSION - SIMPLE MED
62.5000 mL/h | INTRAVENOUS | Status: DC
  Filled 2014-09-02: qty 1000

## 2014-09-02 MED ORDER — LACTATED RINGERS IV SOLN
INTRAVENOUS | Status: DC
  Administered 2014-09-02 (×2): via INTRAUTERINE

## 2014-09-02 MED ORDER — OXYTOCIN BOLUS FROM INFUSION
500.0000 mL | INTRAVENOUS | Status: DC
  Administered 2014-09-02: 500 mL via INTRAVENOUS

## 2014-09-02 MED ORDER — PHENYLEPHRINE 40 MCG/ML (10ML) SYRINGE FOR IV PUSH (FOR BLOOD PRESSURE SUPPORT)
80.0000 ug | PREFILLED_SYRINGE | INTRAVENOUS | Status: DC | PRN
  Filled 2014-09-02: qty 2

## 2014-09-02 MED ORDER — LACTATED RINGERS IV SOLN
INTRAVENOUS | Status: DC
  Administered 2014-09-02: 13:00:00 via INTRAVENOUS

## 2014-09-02 MED ORDER — EPHEDRINE 5 MG/ML INJ
10.0000 mg | INTRAVENOUS | Status: DC | PRN
  Filled 2014-09-02: qty 2

## 2014-09-02 MED ORDER — OXYCODONE-ACETAMINOPHEN 5-325 MG PO TABS: 2.0000 | ORAL_TABLET | ORAL | Status: DC | PRN

## 2014-09-02 MED ORDER — DIPHENHYDRAMINE HCL 50 MG/ML IJ SOLN: 12.5000 mg | INTRAMUSCULAR | Status: DC | PRN

## 2014-09-02 MED ORDER — ACETAMINOPHEN 325 MG PO TABS: 650.0000 mg | ORAL_TABLET | ORAL | Status: DC | PRN

## 2014-09-02 MED ORDER — FENTANYL 2.5 MCG/ML BUPIVACAINE 1/10 % EPIDURAL INFUSION (WH - ANES)
14.0000 mL/h | INTRAMUSCULAR | Status: DC | PRN
  Administered 2014-09-02: 14 mL/h via EPIDURAL
  Filled 2014-09-02 (×2): qty 125

## 2014-09-02 MED ORDER — CITRIC ACID-SODIUM CITRATE 334-500 MG/5ML PO SOLN
30.0000 mL | ORAL | Status: DC | PRN
  Filled 2014-09-02: qty 15

## 2014-09-02 MED ORDER — PHENYLEPHRINE 40 MCG/ML (10ML) SYRINGE FOR IV PUSH (FOR BLOOD PRESSURE SUPPORT)
80.0000 ug | PREFILLED_SYRINGE | INTRAVENOUS | Status: DC | PRN
  Filled 2014-09-02: qty 10
  Filled 2014-09-02: qty 2

## 2014-09-02 NOTE — Plan of Care (Signed)
Problem: Phase I Progression Outcomes Goal: Assess per MD/Nurse,Routine-VS,FHR,UC,Head to Toe assess Outcome: Progressing Pt educated on FHR tracing and FHR decels. Pt knows she is at higher rick for c/s and surgery has already been explained to her per MD and Nurse has answered additional questions.

## 2014-09-02 NOTE — Progress Notes (Signed)
Anita Boyd is a 38 y.o. G1P0000 at [redacted]w[redacted]d by ultrasound admitted for active labor  Subjective:   Objective: BP 141/77  Pulse 90  Temp(Src) 98.1 F (36.7 C) (Oral)  Resp 20  Ht 5\' 10"  (1.778 m)  Wt 225 lb 7 oz (102.258 kg)  BMI 32.35 kg/m2  SpO2 98%  LMP 11/30/2013   Total I/O In: -  Out: 800 [Urine:800]  FHT:  FHR: 125 bpm, variability: moderate,  accelerations:  Present,  decelerations:  Absent UC:   irregular, every 2-3 minutes SVE:   Dilation: 7 Effacement (%): 90 Station: -1 Exam by:: k fields, rn  Labs: Lab Results  Component Value Date   WBC 9.1 09/02/2014   HGB 10.3* 09/02/2014   HCT 31.6* 09/02/2014   MCV 77.1* 09/02/2014   PLT 304 09/02/2014    Assessment / Plan: Augmentation of labor, progressing well  Labor: Progressing normally Preeclampsia:  no signs or symptoms of toxicity Fetal Wellbeing:  Category I Pain Control:  Epidural I/D:  n/a Anticipated MOD:  NSVD  LAWSON, MARIE DARLENE 09/02/2014, 6:46 PM

## 2014-09-02 NOTE — Progress Notes (Signed)
Patient ID: Anita Boyd, female   DOB: 1976-06-18, 38 y.o.   MRN: [id number] S: c/o mod pain from contractions. O: called to room by staff to evaluate questionable late decels but continued variabilty. SVE 3/100/0 AROM cl fluid noted FSE and IUPC inserted without difficulty. Prolonged decel to 60's for several minutes which resolved with trendelenburg position. Dr. Daymon Larsen in to evaluate pt. A; prolonged decel with recovery. Stable maternal-fetal unit P: amnioinfusion and close obs.

## 2014-09-02 NOTE — Anesthesia Preprocedure Evaluation (Signed)
Anesthesia Evaluation  Patient identified by MRN, date of birth, ID band Patient awake    Reviewed: Allergy & Precautions, H&P , NPO status , Patient's Chart, lab work & pertinent test results  History of Anesthesia Complications Negative for: history of anesthetic complications  Airway Mallampati: II TM Distance: >3 FB Neck ROM: full    Dental no notable dental hx. (+) Teeth Intact   Pulmonary neg pulmonary ROS,  breath sounds clear to auscultation  Pulmonary exam normal       Cardiovascular negative cardio ROS  Rhythm:regular Rate:Normal     Neuro/Psych negative neurological ROS  negative psych ROS   GI/Hepatic negative GI ROS, Neg liver ROS,   Endo/Other  negative endocrine ROS  Renal/GU negative Renal ROS  negative genitourinary   Musculoskeletal   Abdominal Normal abdominal exam  (+)   Peds  Hematology negative hematology ROS (+)   Anesthesia Other Findings Has already had 2 long decels  Reproductive/Obstetrics (+) Pregnancy                           Anesthesia Physical Anesthesia Plan  ASA: II  Anesthesia Plan: Epidural   Post-op Pain Management:    Induction:   Airway Management Planned:   Additional Equipment:   Intra-op Plan:   Post-operative Plan:   Informed Consent: I have reviewed the patients History and Physical, chart, labs and discussed the procedure including the risks, benefits and alternatives for the proposed anesthesia with the patient or authorized representative who has indicated his/her understanding and acceptance.     Plan Discussed with:   Anesthesia Plan Comments:         Anesthesia Quick Evaluation

## 2014-09-02 NOTE — H&P (Signed)
Anita Boyd is a 38 y.o. female presenting for labor eval. Maternal Medical History:  Reason for admission: Contractions.  G1 at 39.[redacted] wks gestation in with c/o contractions. Also pt had 7 min fhr decel to 40s with recovery with position change, iv fluids and O2. Cervix has progressed to 2 cm 100/-1 station from dimple. Bloody show noted. Will admit given she is 39.4 and had prolonged decel.  Contractions: Onset was 3-5 hours ago.   Frequency: regular.   Perceived severity is moderate.    Fetal activity: Perceived fetal activity is normal.   Last perceived fetal movement was within the past hour.    Prenatal complications: no prenatal complications   OB History   Grav Para Term Preterm Abortions TAB SAB Ect Mult Living   1 0 0 0 0 0 0 0 0 0      Past Medical History  Diagnosis Date  . Genital herpes   . Acid reflux   . Fibroid, uterine   . GDJMEQAS(341.9)    Past Surgical History  Procedure Laterality Date  . Tendon repair      left hand  . Hand tendon surgery    . Wisdom tooth extraction     Family History: family history is not on file. Social History:  reports that she has never smoked. She has never used smokeless tobacco. She reports that she drinks alcohol. She reports that she does not use illicit drugs.   Prenatal Transfer Tool  Maternal Diabetes: No Genetic Screening: Normal Maternal Ultrasounds/Referrals: Normal Fetal Ultrasounds or other Referrals:  None Maternal Substance Abuse:  No Significant Maternal Medications:  None Significant Maternal Lab Results:  Lab values include: Group B Strep positive Other Comments:  None  Review of Systems  Constitutional: Negative.   HENT: Negative.   Eyes: Negative.   Respiratory: Negative.   Cardiovascular: Negative.   Gastrointestinal: Positive for abdominal pain.  Genitourinary: Negative.   Musculoskeletal: Negative.   Skin: Negative.   Neurological: Negative.   Endo/Heme/Allergies: Negative.    Psychiatric/Behavioral: Negative.     Dilation: Fingertip Station: -1 Exam by:: Feliberto Harts, RN BSN Blood pressure 132/77, pulse 74, temperature 97.9 F (36.6 C), temperature source Oral, resp. rate 18, height 5\' 10"  (1.778 m), weight 225 lb 7 oz (102.258 kg), last menstrual period 11/30/2013. Maternal Exam:  Uterine Assessment: Contraction strength is moderate.  Contraction frequency is regular.   Abdomen: Patient reports no abdominal tenderness. Fetal presentation: vertex  Introitus: Normal vulva.   Fetal Exam Fetal Monitor Review: Mode: ultrasound.   Variability: moderate (6-25 bpm).   Pattern: prolonged decelerations.    Fetal State Assessment: Category II - tracings are indeterminate.     Physical Exam  Vitals reviewed. Constitutional: She is oriented to person, place, and time. She appears well-developed and well-nourished.  HENT:  Head: Normocephalic.  Eyes: Pupils are equal, round, and reactive to light.  Neck: Normal range of motion.  Cardiovascular: Normal rate, regular rhythm, normal heart sounds and intact distal pulses.   Respiratory: Effort normal and breath sounds normal.  GI: Soft. Bowel sounds are normal.  Genitourinary: Vagina normal and uterus normal.  Musculoskeletal: Normal range of motion.  Neurological: She is alert and oriented to person, place, and time. She has normal reflexes.  Skin: Skin is warm and dry.  Psychiatric: She has a normal mood and affect. Her behavior is normal. Judgment and thought content normal.    Prenatal labs: ABO, Rh:   Antibody:   Rubella:   RPR:  HBsAg:    HIV:    GBS:     Assessment/Plan: preg at 39.4 early labor with prolonged decel to 40's will admit and watch closely   [place], [place] 09/02/2014, 10:30 AM

## 2014-09-02 NOTE — Anesthesia Procedure Notes (Signed)
Epidural Patient location during procedure: OB  Staffing Anesthesiologist: Nyala Kirchner Performed by: anesthesiologist   Preanesthetic Checklist Completed: patient identified, site marked, surgical consent, pre-op evaluation, timeout performed, IV checked, risks and benefits discussed and monitors and equipment checked  Epidural Patient position: sitting Prep: ChloraPrep Patient monitoring: heart rate, continuous pulse ox and blood pressure Approach: right paramedian Location: L3-L4 Injection technique: LOR saline  Needle:  Needle type: Tuohy  Needle gauge: 17 G Needle length: 9 cm and 9 Needle insertion depth: 6 cm Catheter type: closed end flexible Catheter size: 20 Guage Catheter at skin depth: 11 cm Test dose: negative  Assessment Events: blood not aspirated, injection not painful, no injection resistance, negative IV test and no paresthesia  Additional Notes   Patient tolerated the insertion well without complications.   

## 2014-09-02 NOTE — Progress Notes (Signed)
Delivery of live viable female by Dr. Jerline Pain at 2121.

## 2014-09-02 NOTE — MAU Note (Signed)
Patient presents with complaint of ctx's since 0600 that are 15-20 minutes apart. Denies bleeding or LOF. Fetus active.

## 2014-09-02 NOTE — Progress Notes (Signed)
At 12:02, d lawson, cnm at pt bedside for arom and insertion of internal monitors. Fhr dropped to 60s during this process. Pt position changed from right lateral to left lateral and then to trendelenburg. IV bolus started and 10 l O2 vai facemask started. Dr Ihor Dow called to room to assess FHR tracing. MD arrived as FHR began to return to baseline. MD discussed the possibility of c/s for delivery due to fetal heart rate tracing and pt understood. Amnio infusion started and pt prepared for epidural placement.

## 2014-09-02 NOTE — H&P (Signed)
Attestation of Attending Supervision of Advanced Practitioner (CNM/NP): Evaluation and management procedures were performed by the Advanced Practitioner under my supervision and collaboration.  I have reviewed the Advanced Practitioner's note and chart, and I agree with the management and plan.  HARRAWAY-SMITH, Citlally Captain 11:04 AM

## 2014-09-03 MED ORDER — OXYCODONE-ACETAMINOPHEN 5-325 MG PO TABS
1.0000 | ORAL_TABLET | ORAL | Status: DC | PRN
  Filled 2014-09-03: qty 1

## 2014-09-03 MED ORDER — TETANUS-DIPHTH-ACELL PERTUSSIS 5-2.5-18.5 LF-MCG/0.5 IM SUSP: 0.5000 mL | Freq: Once | INTRAMUSCULAR | Status: DC

## 2014-09-03 MED ORDER — SIMETHICONE 80 MG PO CHEW: 80.0000 mg | CHEWABLE_TABLET | ORAL | Status: DC | PRN

## 2014-09-03 MED ORDER — ONDANSETRON HCL 4 MG PO TABS
4.0000 mg | ORAL_TABLET | ORAL | Status: DC | PRN
Start: 2014-09-03 — End: 2014-09-04

## 2014-09-03 MED ORDER — ONDANSETRON HCL 4 MG/2ML IJ SOLN: 4.0000 mg | INTRAMUSCULAR | Status: DC | PRN

## 2014-09-03 MED ORDER — OXYCODONE-ACETAMINOPHEN 5-325 MG PO TABS: 2.0000 | ORAL_TABLET | ORAL | Status: DC | PRN

## 2014-09-03 MED ORDER — DIBUCAINE 1 % RE OINT: 1.0000 "application " | TOPICAL_OINTMENT | RECTAL | Status: DC | PRN

## 2014-09-03 MED ORDER — SENNOSIDES-DOCUSATE SODIUM 8.6-50 MG PO TABS
2.0000 | ORAL_TABLET | ORAL | Status: DC
  Administered 2014-09-03 (×2): 2 via ORAL
  Filled 2014-09-03 (×2): qty 2

## 2014-09-03 MED ORDER — ZOLPIDEM TARTRATE 5 MG PO TABS
5.0000 mg | ORAL_TABLET | Freq: Every evening | ORAL | Status: DC | PRN
Start: 2014-09-03 — End: 2014-09-04

## 2014-09-03 MED ORDER — DIPHENHYDRAMINE HCL 25 MG PO CAPS: 25.0000 mg | ORAL_CAPSULE | Freq: Four times a day (QID) | ORAL | Status: DC | PRN

## 2014-09-03 MED ORDER — LANOLIN HYDROUS EX OINT: TOPICAL_OINTMENT | CUTANEOUS | Status: DC | PRN

## 2014-09-03 MED ORDER — BENZOCAINE-MENTHOL 20-0.5 % EX AERO: 1.0000 "application " | INHALATION_SPRAY | CUTANEOUS | Status: DC | PRN

## 2014-09-03 MED ORDER — WITCH HAZEL-GLYCERIN EX PADS: 1.0000 "application " | MEDICATED_PAD | CUTANEOUS | Status: DC | PRN

## 2014-09-03 MED ORDER — PRENATAL MULTIVITAMIN CH
1.0000 | ORAL_TABLET | Freq: Every day | ORAL | Status: DC
  Administered 2014-09-03 – 2014-09-04 (×2): 1 via ORAL
  Filled 2014-09-03 (×2): qty 1

## 2014-09-03 MED ORDER — IBUPROFEN 600 MG PO TABS
600.0000 mg | ORAL_TABLET | Freq: Four times a day (QID) | ORAL | Status: DC
  Administered 2014-09-03 – 2014-09-04 (×7): 600 mg via ORAL
  Filled 2014-09-03 (×7): qty 1

## 2014-09-03 NOTE — Lactation Note (Signed)
This note was copied from the chart of Anita Eliyanna Ault. Lactation Consultation Note Initial visit at 24 hours of age.  Mom reports baby didn't eat a lot today she has been sleepy.  Baby is now awake in crib with hands to mouth and fussy.  Mom working on hand expression, but not seeing colostrum yet.  MBU RN has been assisting and finishing assessment now.  Assisted mom with football hold on left breast with baby STS.  Baby latches well with little assist.  Baby has strong rhythmic sucking for several minutes.  Mom continues to work on positioning.    Patient Name: Anita Boyd [id number] Date: 09/03/2014 Reason for consult: Follow-up assessment   Maternal Data Has patient been taught Hand Expression?: Yes  Feeding Feeding Type: Breast Fed Length of feed:  (8 minutes observed)  LATCH Score/Interventions Latch: Grasps breast easily, tongue down, lips flanged, rhythmical sucking. Intervention(s): Skin to skin Intervention(s): Breast compression;Assist with latch  Audible Swallowing: A few with stimulation Intervention(s): Skin to skin;Hand expression;Alternate breast massage  Type of Nipple: Everted at rest and after stimulation  Comfort (Breast/Nipple): Soft / non-tender     Hold (Positioning): Assistance needed to correctly position infant at breast and maintain latch. Intervention(s): Breastfeeding basics reviewed;Support Pillows;Position options;Skin to skin  LATCH Score: 8  Lactation Tools Discussed/Used     Consult Status Consult Status: Follow-up Date: 09/04/14 Follow-up type: In-patient    [place], Justine Null 09/03/2014, 9:35 PM

## 2014-09-03 NOTE — Progress Notes (Signed)
Post Partum Day 1 Subjective:  Anita Boyd is a 38 y.o. G1P1001 [redacted]w[redacted]d s/p NSVD.  No acute events overnight.  Pt denies problems with ambulating, voiding or po intake.  She denies nausea or vomiting.  Pain is well controlled.  She has had flatus. She has not had bowel movement.  Lochia Small.  Plan for birth control is oral contraceptives (estrogen/progesterone).  Method of Feeding: breast  Objective: Blood pressure 122/62, pulse 73, temperature 97.8 F (36.6 C), temperature source Axillary, resp. rate 20, height 5\' 10"  (1.778 m), weight 102.258 kg (225 lb 7 oz), last menstrual period 11/30/2013, SpO2 98.00%, unknown if currently breastfeeding.  Physical Exam:  General: alert, cooperative and no distress Lochia:normal flow Chest: CTAB Heart: RRR no m/r/g Abdomen: +BS, soft, nontender,  Uterine Fundus: firm, below umbilicus DVT Evaluation: No evidence of DVT seen on physical exam. Extremities: No edema   Recent Labs  09/02/14 1128  HGB 10.3*  HCT 31.6*    Assessment/Plan:  ASSESSMENT: Anita Boyd is a 38 y.o. G1P1001 [redacted]w[redacted]d s/p NSVD  Plan for discharge tomorrow   LOS: 1 day   Dimas Chyle 09/03/2014, 7:51 AM   OB fellow attestation Post Partum Day 1 I have seen and examined this patient and agree with above documentation in the resident's note.   Anita Boyd is a 38 y.o. G1P1001 s/p SVD.  Pt denies problems with ambulating, voiding or po intake. Pain is well controlled.  Plan for birth control is OCPs.  Method of Feeding: breast  PE:  BP 122/62  Pulse 73  Temp(Src) 97.8 F (36.6 C) (Axillary)  Resp 20  Ht 5\' 10"  (1.778 m)  Wt 225 lb 7 oz (102.258 kg)  BMI 32.35 kg/m2  SpO2 98%  LMP 11/30/2013  Breastfeeding? Unknown Fundus firm  Plan for discharge: tomorrow  Merla Riches, MD 12:27 PM

## 2014-09-03 NOTE — Anesthesia Postprocedure Evaluation (Signed)
  Anesthesia Post-op Note  Patient: Anita Boyd  Procedure(s) Performed: * No procedures listed *  Patient Location: Mother/Baby  Anesthesia Type:Epidural  Level of Consciousness: awake, alert , oriented and patient cooperative  Airway and Oxygen Therapy: Patient Spontanous Breathing  Post-op Pain: mild  Post-op Assessment: Patient's Cardiovascular Status Stable, Respiratory Function Stable, No headache, No backache, No residual numbness and No residual motor weakness  Post-op Vital Signs: stable  Last Vitals:  Filed Vitals:   09/03/14 0500  BP: 122/62  Pulse: 73  Temp: 36.6 C  Resp: 20    Complications: No apparent anesthesia complications

## 2014-09-03 NOTE — Progress Notes (Signed)
Patient is alert sitting in bed with baby skin to skin.  No complaints at this time.  Asked her to call me when she got up to void.

## 2014-09-03 NOTE — Lactation Note (Signed)
This note was copied from the chart of Anita Boyd. Lactation Consultation Note New mom concern d/t baby will not stay latched on and wants to sleep. Newborn behavior taught. Mom encouraged to feed baby 8-12 times/24 hours and with feeding cues. Mom encouraged to waken baby for feeds. Hand expression taught to Mom mom concerned baby wasn't getting anything. Reviewed about hearing swallow sounds. No colostrum noted during hand expression. Reviewed monitoring output. Mom reports + breast changes w/pregnancy. Encouraged to call for assistance if needed and to verify proper latch.Referred to Baby and Me Book in Breastfeeding section Pg. 22-23 for position options and Proper latch demonstration. [place] brochure given w/resources, support groups and [facility] services. Encouraged comfort during BF so colostrum flows better and mom will enjoy the feeding longer. Taking deep breaths and breast massage during BF. Mom encouraged to do skin-to-skin. Mom needs reassurance of newborn education and BF cues. Mom attentive and caring towards baby. Reviewed several positions for BF and "C" hold and rolling nipples prior to latching. Baby obtained good latch w/o pain. Patient Name: Anita Jahmiyah Dullea DHRCB'U Date: 09/03/2014 Reason for consult: Initial assessment   Maternal Data Has patient been taught Hand Expression?: Yes Does the patient have breastfeeding experience prior to this delivery?: No  Feeding Feeding Type: Breast Fed Length of feed: 10 min  LATCH Score/Interventions Latch: Grasps breast easily, tongue down, lips flanged, rhythmical sucking. Intervention(s): Skin to skin;Teach feeding cues;Waking techniques Intervention(s): Adjust position;Assist with latch;Breast massage;Breast compression  Audible Swallowing: A few with stimulation Intervention(s): Skin to skin;Hand expression;Alternate breast massage  Type of Nipple: Everted at rest and after stimulation  Comfort (Breast/Nipple): Soft /  non-tender     Hold (Positioning): Assistance needed to correctly position infant at breast and maintain latch. Intervention(s): Breastfeeding basics reviewed;Support Pillows;Position options;Skin to skin  LATCH Score: 8  Lactation Tools Discussed/Used     Consult Status Consult Status: Follow-up Date: 09/03/14 Follow-up type: In-patient    Tyrone Pautsch, Elta [place] 09/03/2014, 5:14 AM

## 2014-09-04 NOTE — Discharge Summary (Signed)
Anita Boyd is a 38 yo G1P1, ppd#2, SVD of healthy baby girl. Breastfeeding well. Plans to use OCPs for birth control.   Obstetric Discharge Summary Reason for Admission: onset of labor Prenatal Procedures: none Intrapartum Procedures: spontaneous vaginal delivery Postpartum Procedures: none Complications-Operative and Postpartum: 2nd degree perineal laceration Hemoglobin  Date Value Ref Range Status  09/02/2014 10.3* 12.0 - 15.0 g/dL Final     HCT  Date Value Ref Range Status  09/02/2014 31.6* 36.0 - 46.0 % Final    Physical Exam:  General: alert, cooperative and no distress Lochia: appropriate Uterine Fundus: firm DVT Evaluation: No evidence of DVT seen on physical exam.  Discharge Diagnoses: Term Pregnancy-delivered  Discharge Information: Date: 09/04/2014 Activity: pelvic rest Diet: routine Medications: Ibuprofen Condition: stable Instructions: refer to practice specific booklet Discharge to: home Follow-up Information   Follow up with PHD. Schedule an appointment as soon as possible for a visit in 6 weeks. (For post partum follow up )       Newborn Data: Live born female  Birth Weight: 6 lb 11 oz (3033 g) APGAR: 8, 9  Home with mother.  Anita Boyd 09/04/2014, 7:59 AM

## 2014-09-17 ENCOUNTER — Encounter (HOSPITAL_COMMUNITY): Payer: Self-pay | Admitting: *Deleted

## 2014-10-18 ENCOUNTER — Emergency Department (HOSPITAL_COMMUNITY)
Admission: EM | Admit: 2014-10-18 | Discharge: 2014-10-18 | Disposition: A | Discharge status: Home / Self Care | Payer: Medicaid Other | Source: Home / Self Care | Attending: Family Medicine | Admitting: Family Medicine

## 2014-10-18 ENCOUNTER — Encounter (HOSPITAL_COMMUNITY): Payer: Self-pay | Admitting: *Deleted

## 2014-10-18 DIAGNOSIS — J069 Acute upper respiratory infection, unspecified: Secondary | ICD-10-CM

## 2014-10-18 DIAGNOSIS — J329 Chronic sinusitis, unspecified: Secondary | ICD-10-CM

## 2014-10-18 DIAGNOSIS — J039 Acute tonsillitis, unspecified: Secondary | ICD-10-CM

## 2014-10-18 LAB — POCT RAPID STREP A: STREPTOCOCCUS, GROUP A SCREEN (DIRECT): NEGATIVE

## 2014-10-18 MED ORDER — AMOXICILLIN-POT CLAVULANATE 875-125 MG PO TABS: 1.0000 | ORAL_TABLET | Freq: Two times a day (BID) | ORAL | Status: AC

## 2014-10-18 MED ORDER — FLUTICASONE PROPIONATE 50 MCG/ACT NA SUSP: 2.0000 | Freq: Two times a day (BID) | NASAL | Status: AC

## 2014-10-18 MED ORDER — CHLORPHENIRAMINE-PSE-IBUPROFEN 2-30-200 MG PO TABS: ORAL_TABLET | ORAL | Status: AC

## 2014-10-18 NOTE — ED Notes (Signed)
Pt  Has    Symptoms  of    sorethroat        /  Sinus drainage     With  Body    Aches      Nasal congestion   And           Headache       Pt  Reports   Symptoms  Began  Last  Pm

## 2014-10-18 NOTE — Discharge Instructions (Signed)
Sinusitis Sinusitis is redness, soreness, and inflammation of the paranasal sinuses. Paranasal sinuses are air pockets within the bones of your face (beneath the eyes, the middle of the forehead, or above the eyes). In healthy paranasal sinuses, mucus is able to drain out, and air is able to circulate through them by way of your nose. However, when your paranasal sinuses are inflamed, mucus and air can become trapped. This can allow bacteria and other germs to grow and cause infection. Sinusitis can develop quickly and last only a short time (acute) or continue over a long period (chronic). Sinusitis that lasts for more than 12 weeks is considered chronic.  CAUSES  Causes of sinusitis include:  Allergies.  Structural abnormalities, such as displacement of the cartilage that separates your nostrils (deviated septum), which can decrease the air flow through your nose and sinuses and affect sinus drainage.  Functional abnormalities, such as when the small hairs (cilia) that line your sinuses and help remove mucus do not work properly or are not present. SIGNS AND SYMPTOMS  Symptoms of acute and chronic sinusitis are the same. The primary symptoms are pain and pressure around the affected sinuses. Other symptoms include:  Upper toothache.  Earache.  Headache.  Bad breath.  Decreased sense of smell and taste.  A cough, which worsens when you are lying flat.  Fatigue.  Fever.  Thick drainage from your nose, which often is green and may contain pus (purulent).  Swelling and warmth over the affected sinuses. DIAGNOSIS  Your health care provider will perform a physical exam. During the exam, your health care provider may:  Look in your nose for signs of abnormal growths in your nostrils (nasal polyps).  Tap over the affected sinus to check for signs of infection.  View the inside of your sinuses (endoscopy) using an imaging device that has a light attached (endoscope). If your health  care provider suspects that you have chronic sinusitis, one or more of the following tests may be recommended:  Allergy tests.  Nasal culture. A sample of mucus is taken from your nose, sent to a lab, and screened for bacteria.  Nasal cytology. A sample of mucus is taken from your nose and examined by your health care provider to determine if your sinusitis is related to an allergy. TREATMENT  Most cases of acute sinusitis are related to a viral infection and will resolve on their own within 10 days. Sometimes medicines are prescribed to help relieve symptoms (pain medicine, decongestants, nasal steroid sprays, or saline sprays).  However, for sinusitis related to a bacterial infection, your health care provider will prescribe antibiotic medicines. These are medicines that will help kill the bacteria causing the infection.  Rarely, sinusitis is caused by a fungal infection. In theses cases, your health care provider will prescribe antifungal medicine. For some cases of chronic sinusitis, surgery is needed. Generally, these are cases in which sinusitis recurs more than 3 times per year, despite other treatments. HOME CARE INSTRUCTIONS   Drink plenty of water. Water helps thin the mucus so your sinuses can drain more easily.  Use a humidifier.  Inhale steam 3 to 4 times a day (for example, sit in the bathroom with the shower running).  Apply a warm, moist washcloth to your face 3 to 4 times a day, or as directed by your health care provider.  Use saline nasal sprays to help moisten and clean your sinuses.  Take medicines only as directed by your health care provider.    If you were prescribed either an antibiotic or antifungal medicine, finish it all even if you start to feel better. SEEK IMMEDIATE MEDICAL CARE IF:  You have increasing pain or severe headaches.  You have nausea, vomiting, or drowsiness.  You have swelling around your face.  You have vision problems.  You have a stiff  neck.  You have difficulty breathing. MAKE SURE YOU:   Understand these instructions.  Will watch your condition.  Will get help right away if you are not doing well or get worse. Document Released: 11/02/2005 Document Revised: 03/19/2014 Document Reviewed: 11/17/2011 [facility] Patient Information 2015 [place], [place]. This information is not intended to replace advice given to you by your health care provider. Make sure you discuss any questions you have with your health care provider.  Tonsillitis Tonsillitis is an infection of the throat that causes the tonsils to become red, tender, and swollen. Tonsils are collections of lymphoid tissue at the back of the throat. Each tonsil has crevices (crypts). Tonsils help fight nose and throat infections and keep infection from spreading to other parts of the body for the first 18 months of life.  CAUSES Sudden (acute) tonsillitis is usually caused by infection with streptococcal bacteria. Long-lasting (chronic) tonsillitis occurs when the crypts of the tonsils become filled with pieces of food and bacteria, which makes it easy for the tonsils to become repeatedly infected. SYMPTOMS  Symptoms of tonsillitis include:  A sore throat, with possible difficulty swallowing.  White patches on the tonsils.  Fever.  Tiredness.  New episodes of snoring during sleep, when you did not snore before.  Small, foul-smelling, yellowish-white pieces of material (tonsilloliths) that you occasionally cough up or spit out. The tonsilloliths can also cause you to have bad breath. DIAGNOSIS Tonsillitis can be diagnosed through a physical exam. Diagnosis can be confirmed with the results of lab tests, including a throat culture. TREATMENT  The goals of tonsillitis treatment include the reduction of the severity and duration of symptoms and prevention of associated conditions. Symptoms of tonsillitis can be improved with the use of steroids to reduce the swelling.  Tonsillitis caused by bacteria can be treated with antibiotic medicines. Usually, treatment with antibiotic medicines is started before the cause of the tonsillitis is known. However, if it is determined that the cause is not bacterial, antibiotic medicines will not treat the tonsillitis. If attacks of tonsillitis are severe and frequent, your health care provider may recommend surgery to remove the tonsils (tonsillectomy). HOME CARE INSTRUCTIONS   Rest as much as possible and get plenty of sleep.  Drink plenty of fluids. While the throat is very sore, eat soft foods or liquids, such as sherbet, soups, or instant breakfast drinks.  Eat frozen ice pops.  Gargle with a warm or cold liquid to help soothe the throat. Mix 1/4 teaspoon of salt and 1/4 teaspoon of baking soda in 8 oz of water. SEEK MEDICAL CARE IF:   Large, tender lumps develop in your neck.  A rash develops.  A green, yellow-brown, or bloody substance is coughed up.  You are unable to swallow liquids or food for 24 hours.  You notice that only one of the tonsils is swollen. SEEK IMMEDIATE MEDICAL CARE IF:   You develop any new symptoms such as vomiting, severe headache, stiff neck, chest pain, or trouble breathing or swallowing.  You have severe throat pain along with drooling or voice changes.  You have severe pain, unrelieved with recommended medications.  You are unable  to fully open the mouth.  You develop redness, swelling, or severe pain anywhere in the neck.  You have a fever. MAKE SURE YOU:   Understand these instructions.  Will watch your condition.  Will get help right away if you are not doing well or get worse. Document Released: 08/12/2005 Document Revised: 03/19/2014 Document Reviewed: 04/21/2013 [facility] Patient Information 2015 [place], [place]. This information is not intended to replace advice given to you by your health care provider. Make sure you discuss any questions you have with your health  care provider.  Upper Respiratory Infection, Adult An upper respiratory infection (URI) is also sometimes known as the common cold. The upper respiratory tract includes the nose, sinuses, throat, trachea, and bronchi. Bronchi are the airways leading to the lungs. Most people improve within 1 week, but symptoms can last up to 2 weeks. A residual cough may last even longer.  CAUSES Many different viruses can infect the tissues lining the upper respiratory tract. The tissues become irritated and inflamed and often become very moist. Mucus production is also common. A cold is contagious. You can easily spread the virus to others by oral contact. This includes kissing, sharing a glass, coughing, or sneezing. Touching your mouth or nose and then touching a surface, which is then touched by another person, can also spread the virus. SYMPTOMS  Symptoms typically develop 1 to 3 days after you come in contact with a cold virus. Symptoms vary from person to person. They may include:  Runny nose.  Sneezing.  Nasal congestion.  Sinus irritation.  Sore throat.  Loss of voice (laryngitis).  Cough.  Fatigue.  Muscle aches.  Loss of appetite.  Headache.  Low-grade fever. DIAGNOSIS  You might diagnose your own cold based on familiar symptoms, since most people get a cold 2 to 3 times a year. Your caregiver can confirm this based on your exam. Most importantly, your caregiver can check that your symptoms are not due to another disease such as strep throat, sinusitis, pneumonia, asthma, or epiglottitis. Blood tests, throat tests, and X-rays are not necessary to diagnose a common cold, but they may sometimes be helpful in excluding other more serious diseases. Your caregiver will decide if any further tests are required. RISKS AND COMPLICATIONS  You may be at risk for a more severe case of the common cold if you smoke cigarettes, have chronic heart disease (such as heart failure) or lung disease (such  as asthma), or if you have a weakened immune system. The very young and very old are also at risk for more serious infections. Bacterial sinusitis, middle ear infections, and bacterial pneumonia can complicate the common cold. The common cold can worsen asthma and chronic obstructive pulmonary disease (COPD). Sometimes, these complications can require emergency medical care and may be life-threatening. PREVENTION  The best way to protect against getting a cold is to practice good hygiene. Avoid oral or hand contact with people with cold symptoms. Wash your hands often if contact occurs. There is no clear evidence that vitamin C, vitamin E, echinacea, or exercise reduces the chance of developing a cold. However, it is always recommended to get plenty of rest and practice good nutrition. TREATMENT  Treatment is directed at relieving symptoms. There is no cure. Antibiotics are not effective, because the infection is caused by a virus, not by bacteria. Treatment may include:  Increased fluid intake. Sports drinks offer valuable electrolytes, sugars, and fluids.  Breathing heated mist or steam (vaporizer or shower).  Eating chicken soup or other clear broths, and maintaining good nutrition.  Getting plenty of rest.  Using gargles or lozenges for comfort.  Controlling fevers with ibuprofen or acetaminophen as directed by your caregiver.  Increasing usage of your inhaler if you have asthma. Zinc gel and zinc lozenges, taken in the first 24 hours of the common cold, can shorten the duration and lessen the severity of symptoms. Pain medicines may help with fever, muscle aches, and throat pain. A variety of non-prescription medicines are available to treat congestion and runny nose. Your caregiver can make recommendations and may suggest nasal or lung inhalers for other symptoms.  HOME CARE INSTRUCTIONS   Only take over-the-counter or prescription medicines for pain, discomfort, or fever as directed by  your caregiver.  Use a warm mist humidifier or inhale steam from a shower to increase air moisture. This may keep secretions moist and make it easier to breathe.  Drink enough water and fluids to keep your urine clear or pale yellow.  Rest as needed.  Return to work when your temperature has returned to normal or as your caregiver advises. You may need to stay home longer to avoid infecting others. You can also use a face mask and careful hand washing to prevent spread of the virus. SEEK MEDICAL CARE IF:   After the first few days, you feel you are getting worse rather than better.  You need your caregiver's advice about medicines to control symptoms.  You develop chills, worsening shortness of breath, or brown or red sputum. These may be signs of pneumonia.  You develop yellow or brown nasal discharge or pain in the face, especially when you bend forward. These may be signs of sinusitis.  You develop a fever, swollen neck glands, pain with swallowing, or white areas in the back of your throat. These may be signs of strep throat. SEEK IMMEDIATE MEDICAL CARE IF:   You have a fever.  You develop severe or persistent headache, ear pain, sinus pain, or chest pain.  You develop wheezing, a prolonged cough, cough up blood, or have a change in your usual mucus (if you have chronic lung disease).  You develop sore muscles or a stiff neck. Document Released: 04/28/2001 Document Revised: 01/25/2012 Document Reviewed: 02/07/2014 [facility] Patient Information 2015 [place], [place]. This information is not intended to replace advice given to you by your health care provider. Make sure you discuss any questions you have with your health care provider.

## 2014-10-18 NOTE — ED Provider Notes (Signed)
CSN: [id number]     Arrival date & time 10/18/14  1202 History   First MD Initiated Contact with Patient 10/18/14 1219     Chief Complaint  Patient presents with  . Sore Throat   (Consider location/radiation/quality/duration/timing/severity/associated sxs/prior Treatment) HPI             38 year old female presents complaining of possible sinus infection. She has sore throat, swollen tonsils, sinus pressure, burning sensation in her nose, itching sensation in the roof of her mouth. Symptoms began yesterday. No treatments tried at home. She is currently breast-feeding. She has a slight dry cough as well. No fever, chills, NVD. No recent travel or sick contacts.  Past Medical History  Diagnosis Date  . Genital herpes   . Acid reflux   . Fibroid, uterine   . MPNTIRWE(315.4)    Past Surgical History  Procedure Laterality Date  . Tendon repair      left hand  . Hand tendon surgery    . Wisdom tooth extraction     History reviewed. No pertinent family history. History  Substance Use Topics  . Smoking status: Never Smoker   . Smokeless tobacco: Never Used  . Alcohol Use: Yes     Comment: 2x/wk   OB History    Gravida Para Term Preterm AB TAB SAB Ectopic Multiple Living   1 1 1  0 0 0 0 0 0 1     Review of Systems  Constitutional: Negative for fever and chills.  HENT: Positive for congestion, ear pain, rhinorrhea, sinus pressure and sore throat. Negative for postnasal drip.   Respiratory: Positive for cough. Negative for chest tightness and shortness of breath.   Cardiovascular: Negative for chest pain.  Gastrointestinal: Negative for nausea, vomiting, abdominal pain and diarrhea.  All other systems reviewed and are negative.   Allergies  Review of patient's allergies indicates no known allergies.  Home Medications   Prior to Admission medications   Medication Sig Start Date End Date Taking? Authorizing Provider  amoxicillin-clavulanate (AUGMENTIN) 875-125 MG per tablet  Take 1 tablet by mouth every 12 (twelve) hours. 10/18/14   Liam Graham, PA-C  Chlorpheniramine-PSE-Ibuprofen (ADVIL ALLERGY SINUS) 2-30-200 MG TABS 1-2 tabs PO Q4-6 hrs PRN 10/18/14   Liam Graham, PA-C  fluticasone (FLONASE) 50 MCG/ACT nasal spray Place 2 sprays into both nostrils 2 (two) times daily. Decrease to 2 sprays/nostril daily after 5 days 10/18/14   Liam Graham, PA-C  Pyridoxine HCl (B-6 PO) Take 1 tablet by mouth 2 (two) times daily.     Historical Provider, MD  ranitidine (ZANTAC) 150 MG tablet Take 1 tablet (150 mg total) by mouth 2 (two) times daily. 02/05/14   Darrelyn Hillock Rasch, NP   BP 124/88 mmHg  Pulse 89  Temp(Src) 97.9 F (36.6 C) (Oral)  Resp 18  SpO2 97% Physical Exam  Constitutional: She is oriented to person, place, and time. Vital signs are normal. She appears well-developed and well-nourished. No distress.  HENT:  Head: Normocephalic and atraumatic.  Right Ear: Tympanic membrane, external ear and ear canal normal.  Left Ear: Tympanic membrane, external ear and ear canal normal.  Nose: Right sinus exhibits maxillary sinus tenderness and frontal sinus tenderness. Left sinus exhibits maxillary sinus tenderness and frontal sinus tenderness.  Mouth/Throat: Uvula is midline and mucous membranes are normal. Posterior oropharyngeal erythema present. No oropharyngeal exudate.  3+ tonsillar enlargement bilaterally without erythema or exudate  Cardiovascular: Normal rate, regular rhythm and normal heart sounds.  Pulmonary/Chest: Effort normal and breath sounds normal. No respiratory distress.  Neurological: She is alert and oriented to person, place, and time. She has normal strength. Coordination normal.  Skin: Skin is warm and dry. No rash noted. She is not diaphoretic.  Psychiatric: She has a normal mood and affect. Judgment normal.  Nursing note and vitals reviewed.   ED Course  Procedures (including critical care time) Labs Review Labs Reviewed  POCT  RAPID STREP A ([facility] CARE ONLY)    Imaging Review No results found.   MDM   1. Rhinosinusitis   2. Tonsillitis   3. Viral URI    Discussed with the patient on this can usually be treated without antibiotics and it will resolve on its own. I discussed with her reasons why she may need an antibiotic. She will be treated symptomatically for now and I will give her a prescription for Augmentin to fill if worsening or if no improvement in one week. Follow-up when necessary   Meds ordered this encounter  Medications  . fluticasone (FLONASE) 50 MCG/ACT nasal spray    Sig: Place 2 sprays into both nostrils 2 (two) times daily. Decrease to 2 sprays/nostril daily after 5 days    Dispense:  16 g    Refill:  2    Order Specific Question:  Supervising Provider    Answer:  Billy Fischer [phone]  . Chlorpheniramine-PSE-Ibuprofen (ADVIL ALLERGY SINUS) 2-30-200 MG TABS    Sig: 1-2 tabs PO Q4-6 hrs PRN    Dispense:  30 each    Refill:  1    Order Specific Question:  Supervising Provider    Answer:  Billy Fischer [phone]  . amoxicillin-clavulanate (AUGMENTIN) 875-125 MG per tablet    Sig: Take 1 tablet by mouth every 12 (twelve) hours.    Dispense:  14 tablet    Refill:  0    Order Specific Question:  Supervising Provider    Answer:  Ihor Gully D [place], PA-C 10/18/14 1243

## 2014-10-20 LAB — CULTURE, GROUP A STREP

## 2015-08-14 IMAGING — US US OB TRANSVAGINAL
1 series · 14 of 28 positions shown · non-contrast
Comparison: None.

CLINICAL DATA: Abdominal pain and nausea.  Positive pregnancy test.

EXAM:
OBSTETRIC <14 WK US AND TRANSVAGINAL OB US
TECHNIQUE: Both transabdominal and transvaginal ultrasound examinations were
performed for complete evaluation of the gestation as well as the
maternal uterus, adnexal regions, and pelvic cul-de-sac.
Transvaginal technique was performed to assess early pregnancy.

[Series 1: us ob transvaginal · 0.32mm/px · 14 of 97 slices shown]
[im 4/97]
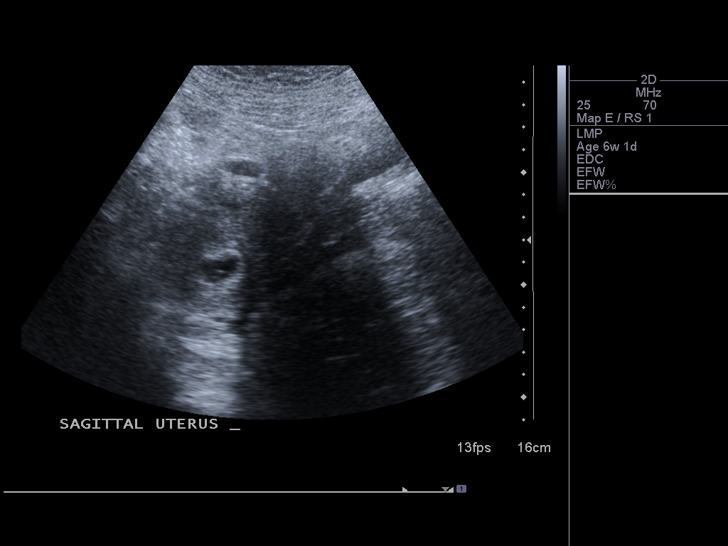
[im 11/97]
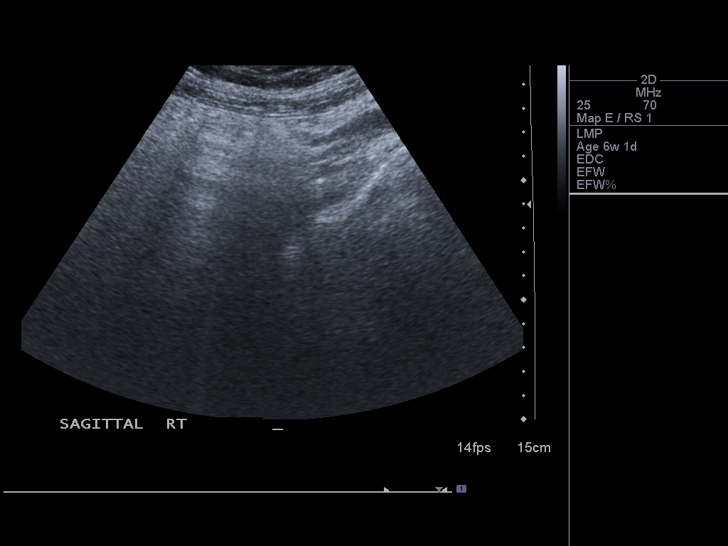
[im 18/97]
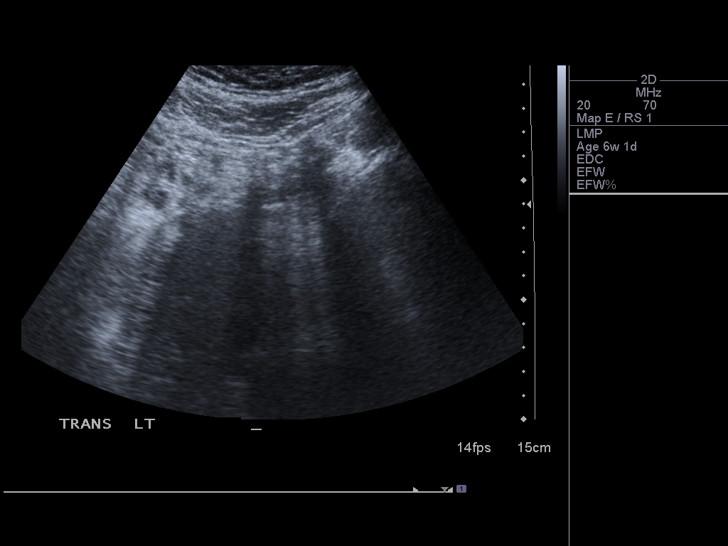
[im 25/97]
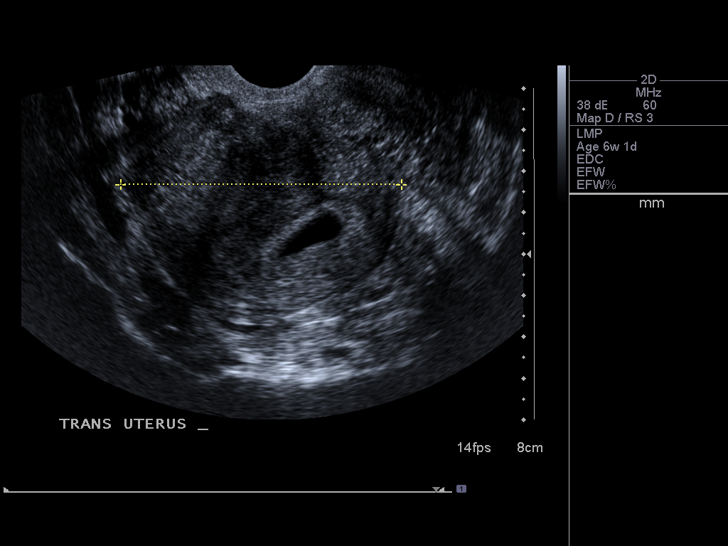
[im 33/97]
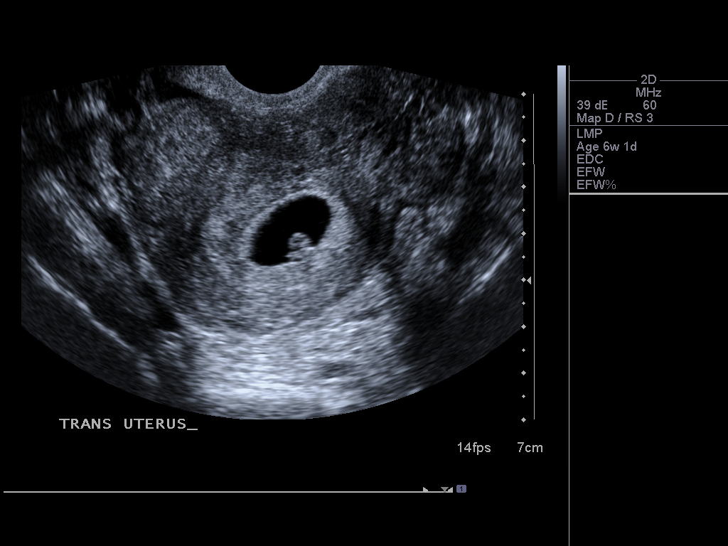
[im 40/97]
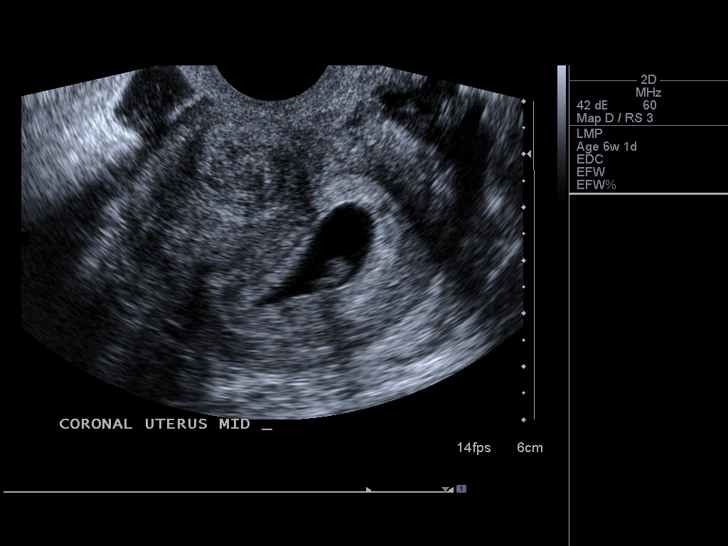
[im 47/97]
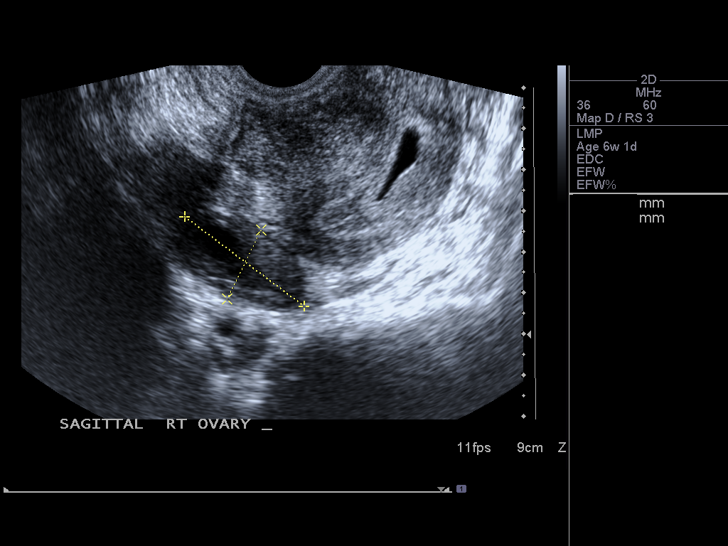
[im 54/97]
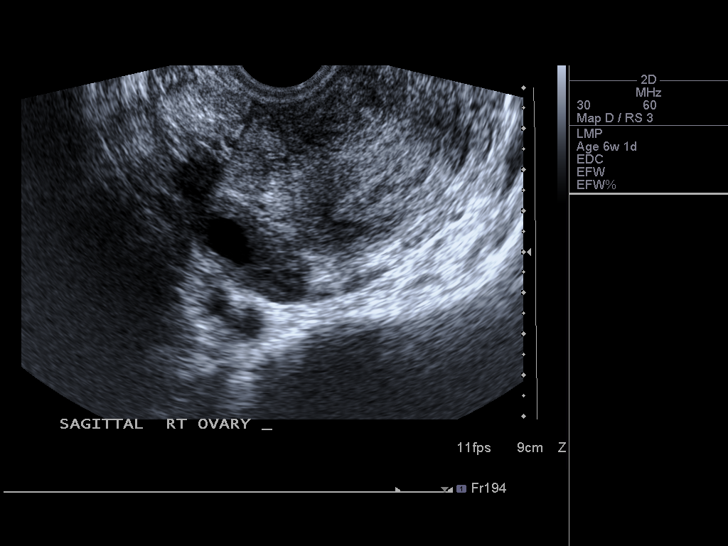
[im 61/97]
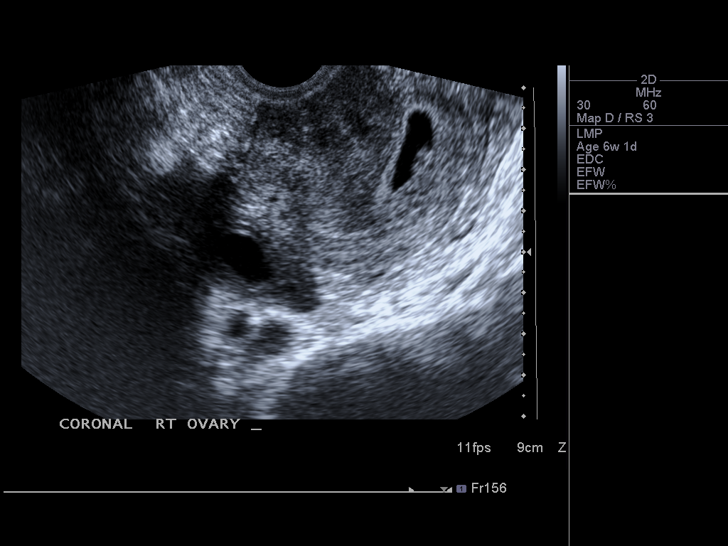
[im 68/97]
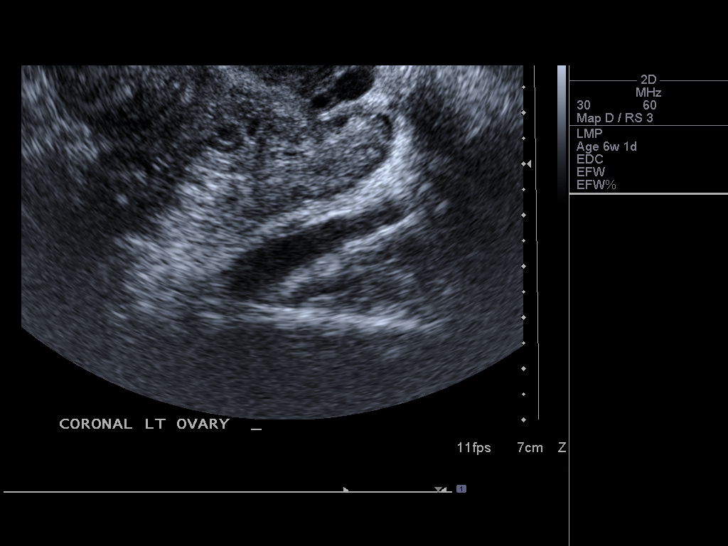
[im 75/97]
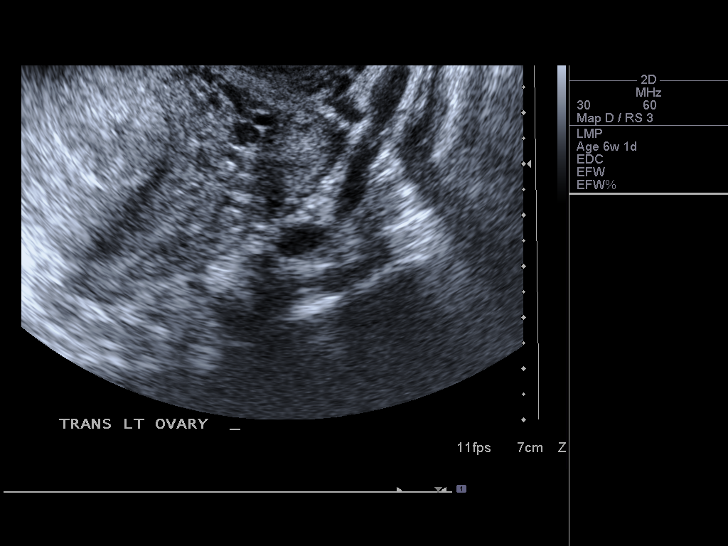
[im 82/97]
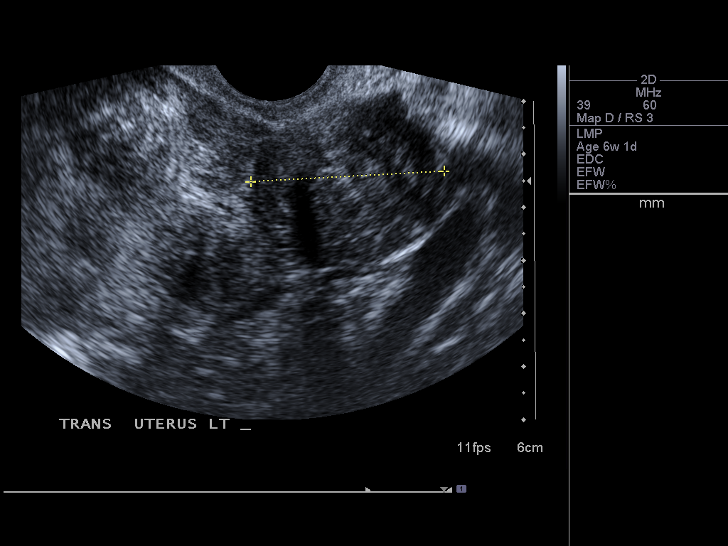
[im 89/97]
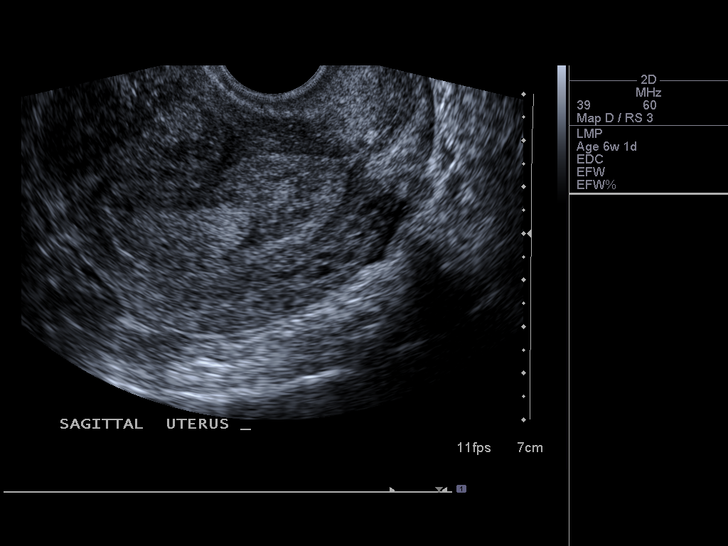
[im 97/97]
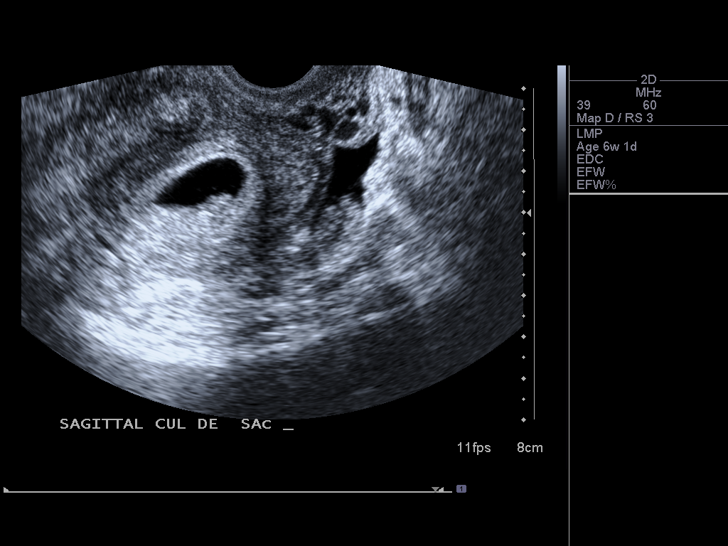

[14 of 28 positions shown; findings below may reference images not displayed]

FINDINGS: Intrauterine gestational sac: Visualized/normal in shape.

Yolk sac:  Visualized

Embryo:  Visualized

Cardiac Activity: Visualized

Heart Rate:  123 bpm

CRL:   7  mm   6 w 4 d                  US EDC: 09/03/14

Maternal uterus/adnexae: No adnexal mass identified. Tiny amount of
free fluid noted in cul-de-sac. A subserosal fibroid is seen in the
left uterus measuring 5.2 cm. A smaller intramural fibroid is seen
in the anterior uterine wall measuring 2.7 cm.
IMPRESSION: Single living IUP measuring 6 weeks 4 days with US EDC of 09/03/14.

At least 2 uterine fibroids, largest measuring 5.2 cm.

## 2015-10-07 IMAGING — US US MFM FETAL NUCHAL TRANSLUCENCY
1 series · 13 of 21 positions shown · non-contrast
Comparison: none

OBSTETRICS REPORT
                      (Signed Final 03/07/2014 [DATE])

Service(s) Provided
 unsuccessful attempt
Indications
 Advanced maternal age (38), Primigravida
Fetal Evaluation
 Num Of Fetuses:    1
 Fetal Heart Rate:  150                          bpm
 Cardiac Activity:  Observed
 Presentation:      Breech
 Placenta:          Posterior, above cervical
                    os
 P. Cord            Visualized
 Insertion:
 Amniotic Fluid
 AFI FV:      Subjectively within normal limits
Gestational Age
 LMP:           13w 6d        Date:  11/30/13                 EDD:   09/06/14
 Best:          13w 6d     Det. By:  LMP  (11/30/13)          EDD:   09/06/14
1st Trimester Genetic Sonogram Screening
 CRL:            90.5  mm    G. Age:   N/A
Cervix Uterus Adnexa
 Cervical Length:    3        cm
 Cervix:       Normal appearance by transabdominal scan. Appears
               closed, without funnelling.
 Left Ovary:    Not visualized. No adnexal mass visualized.
 Right Ovary:   Not visualized. No adnexal mass visualized.
Myomas
 Site                     L(cm)      W(cm)       D(cm)      Location
 Left                     4.9        3.6         3.6        Pedunculated
 Anterior                 3
 Anterior                 2.7        2
 Blood Flow                  RI       PI       Comments
Impression
INDICATION: 38 yr old G1P0 at 62w2d for first trimester screen.

[Series 1: us mfm fetal nuchal translucency · 0.26mm/px · 13 of 21 slices shown]
[im 1/21]
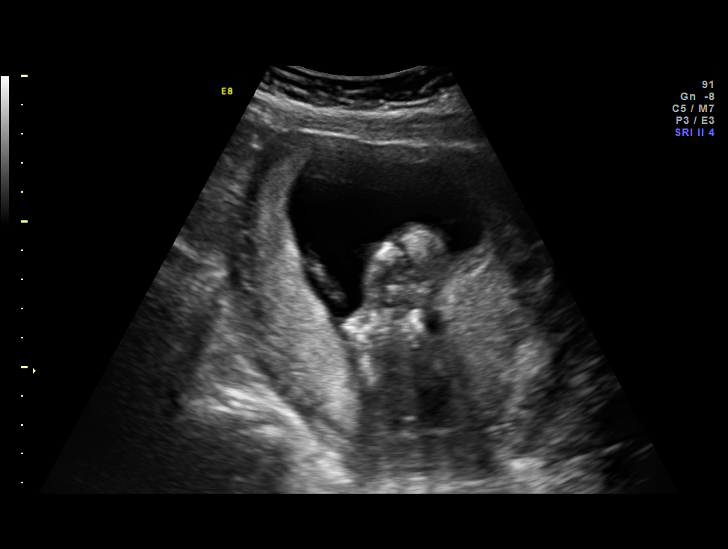
[im 3/21]
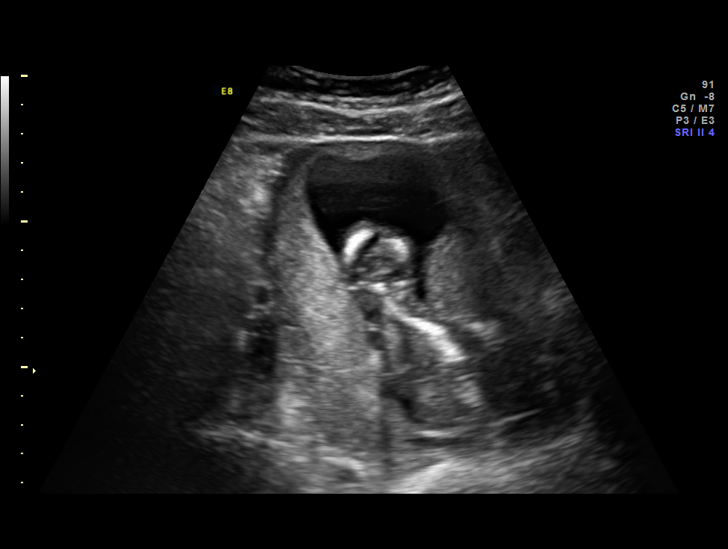
[im 5/21]
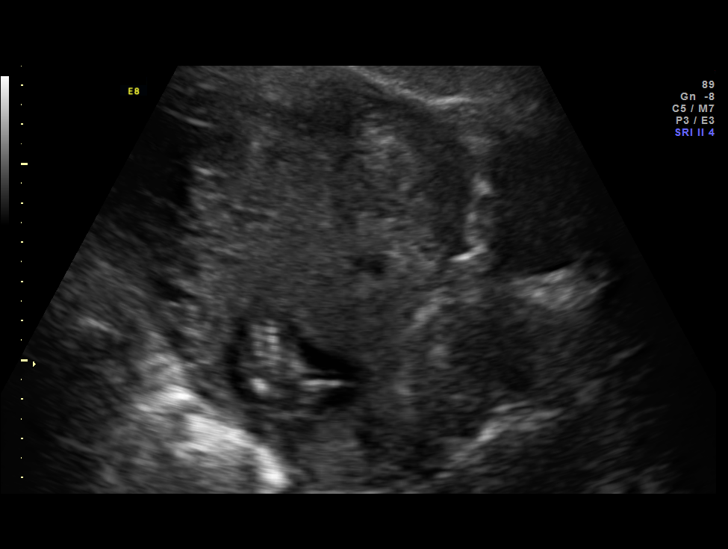
[im 6/21]
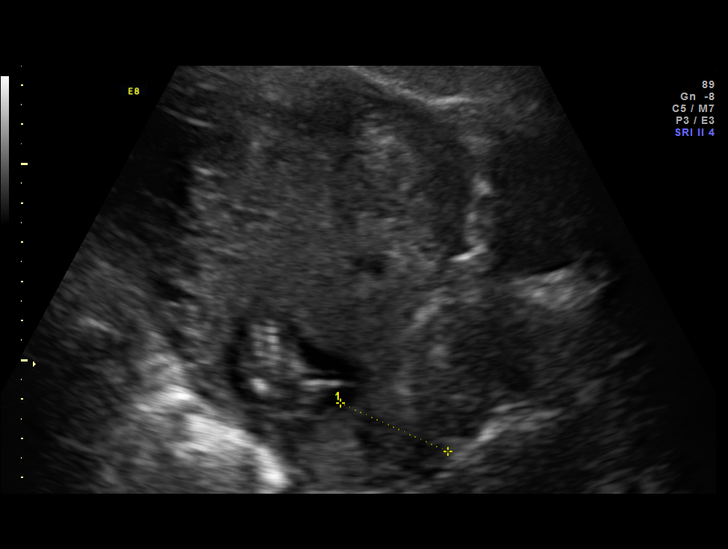
[im 8/21]
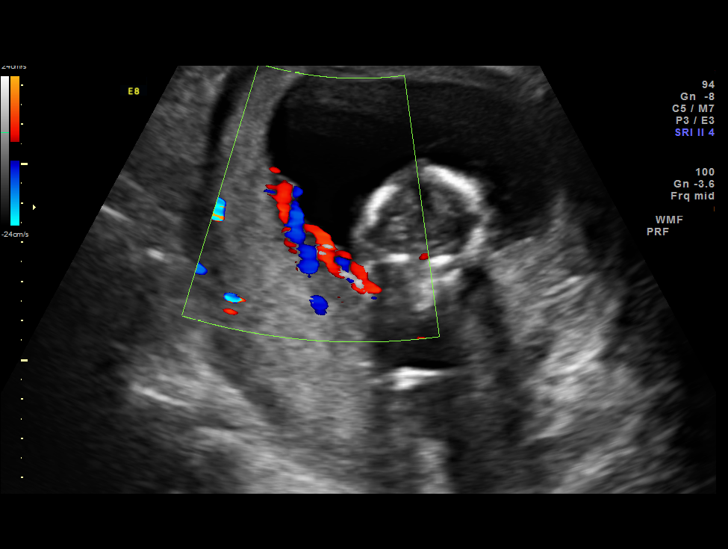
[im 9/21]
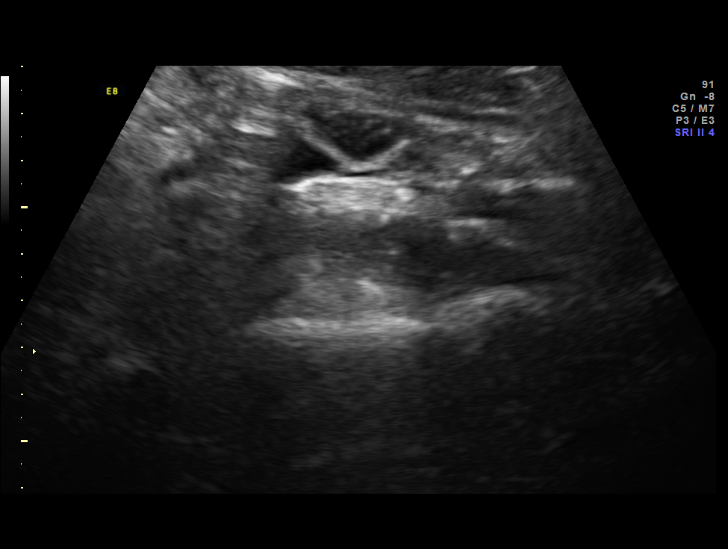
[im 11/21]
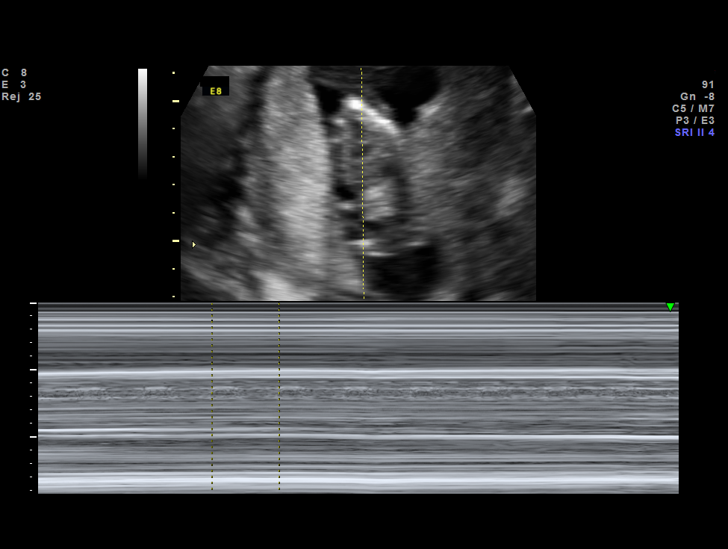
[im 13/21]
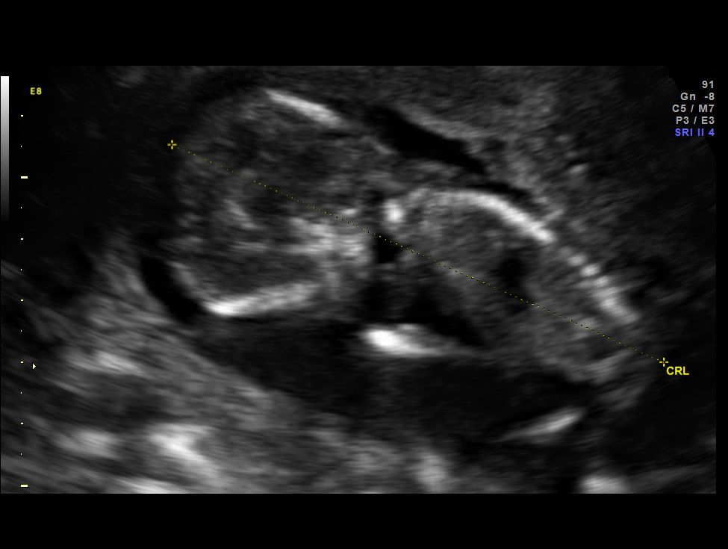
[im 14/21]
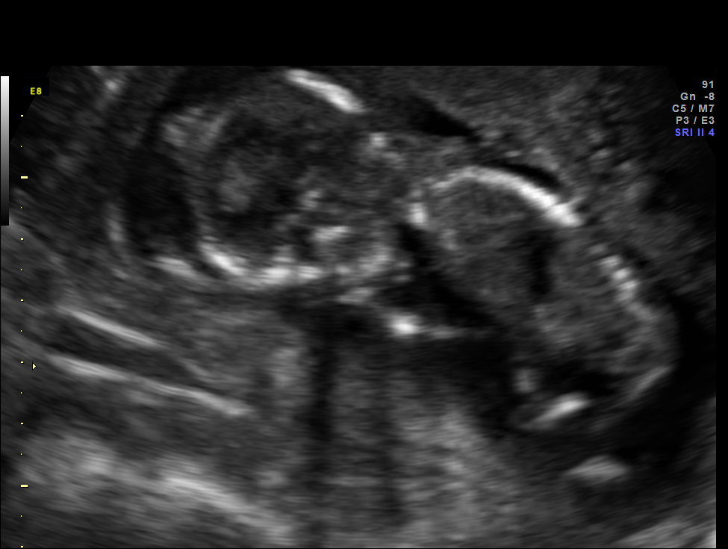
[im 16/21]
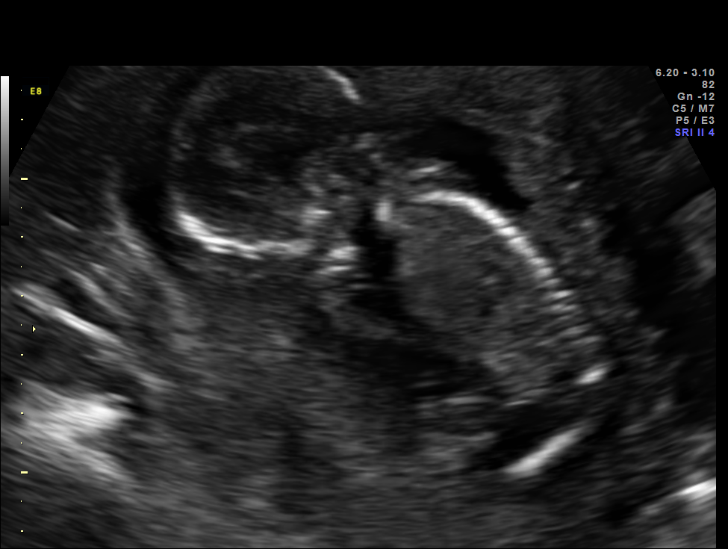
[im 17/21]
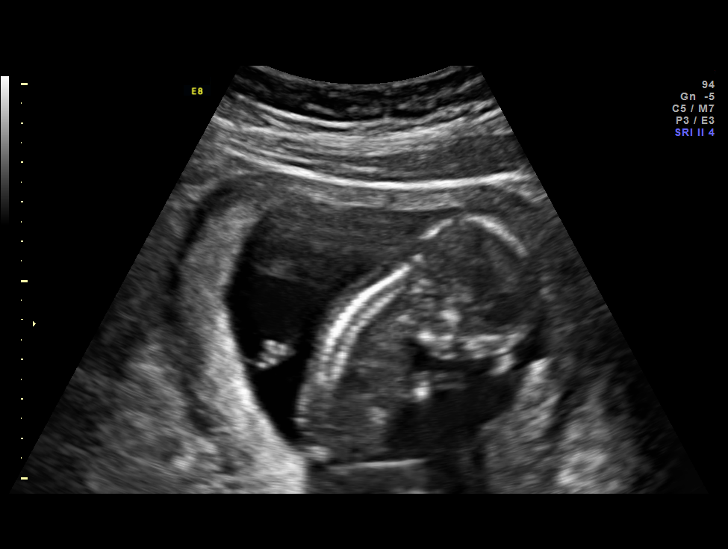
[im 19/21]
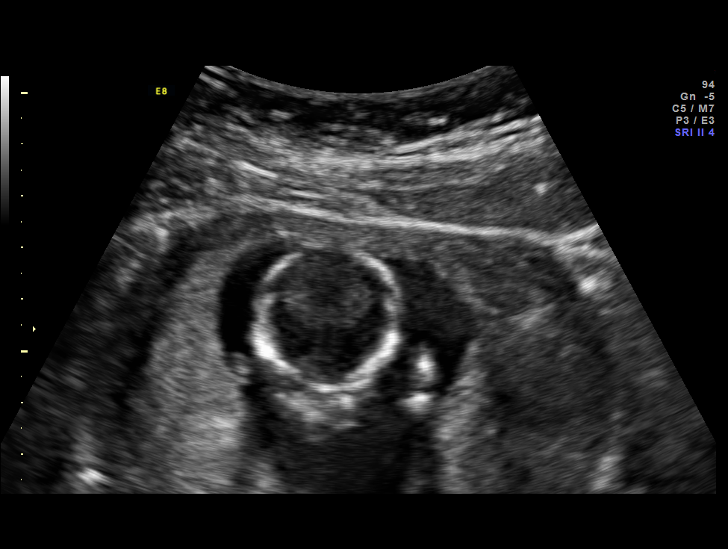
[im 21/21]
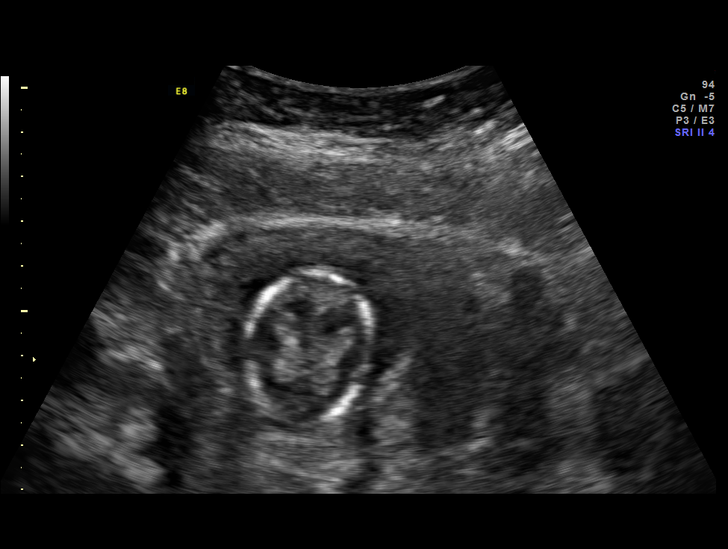

[13 of 21 positions shown; findings below may reference images not displayed]

FINDINGS: 1. Single intrauterine pregnancy.
 2. Fetal crown rump length is consistent with dating.
 3. Normal uterus; again seen are several fibroids the largest
 measuring 5cm.
Recommendations

 1. Nuchal translucency could not be obtained as crown rump
 length exceeds limit for first trimester screen.
 2. Advanced maternal age:
 - previously counseled
 - given cannot have first trimester screen discussed other
 options for screening of cell free fetal DNA and quad screen
 and limitations in detecting fetal aneuploidy
 - patient opted for cell free fetal DNA which was drawn today
 3. Recommend maternal serum AFP at 15-20 weeks
 4. Recommend fetal anatomic survey at 18-20 weeks
 5. Uterine fibroids:
 - recommend serial fetal growth ultrasounds starting at 24
 weeks
